# Patient Record
Sex: Female | Born: 1940
Health system: Southern US, Community
[De-identification: ages and names within clinical notes are randomized; demographics above are authoritative.]

## PROBLEM LIST (undated history)

## (undated) DIAGNOSIS — B159 Hepatitis A without hepatic coma: Secondary | ICD-10-CM

## (undated) DIAGNOSIS — H269 Unspecified cataract: Secondary | ICD-10-CM

## (undated) DIAGNOSIS — S0300XA Dislocation of jaw, unspecified side, initial encounter: Secondary | ICD-10-CM

## (undated) DIAGNOSIS — R0600 Dyspnea, unspecified: Secondary | ICD-10-CM

## (undated) DIAGNOSIS — R42 Dizziness and giddiness: Secondary | ICD-10-CM

## (undated) DIAGNOSIS — N6019 Diffuse cystic mastopathy of unspecified breast: Secondary | ICD-10-CM

## (undated) DIAGNOSIS — M1990 Unspecified osteoarthritis, unspecified site: Secondary | ICD-10-CM

## (undated) DIAGNOSIS — I10 Essential (primary) hypertension: Secondary | ICD-10-CM

## (undated) DIAGNOSIS — H33319 Horseshoe tear of retina without detachment, unspecified eye: Secondary | ICD-10-CM

## (undated) DIAGNOSIS — F32A Depression, unspecified: Secondary | ICD-10-CM

## (undated) DIAGNOSIS — M549 Dorsalgia, unspecified: Secondary | ICD-10-CM

## (undated) DIAGNOSIS — D649 Anemia, unspecified: Secondary | ICD-10-CM

## (undated) DIAGNOSIS — F5104 Psychophysiologic insomnia: Secondary | ICD-10-CM

## (undated) HISTORY — DX: Unspecified osteoarthritis, unspecified site: M19.90

## (undated) HISTORY — PX: HAND SURGERY: SHX662

## (undated) HISTORY — PX: PARTIAL HYSTERECTOMY: SHX80

## (undated) HISTORY — PX: PATELLA RECONSTRUCTION: SHX736

## (undated) HISTORY — DX: Psychophysiologic insomnia: F51.04

## (undated) HISTORY — DX: Unspecified cataract: H26.9

## (undated) HISTORY — DX: Horseshoe tear of retina without detachment, unspecified eye: H33.319

## (undated) HISTORY — DX: Dyspnea, unspecified: R06.00

## (undated) HISTORY — DX: Diffuse cystic mastopathy of unspecified breast: N60.19

## (undated) HISTORY — DX: Dorsalgia, unspecified: M54.9

## (undated) HISTORY — DX: Hepatitis a without hepatic coma: B15.9

## (undated) HISTORY — PX: TOTAL KNEE ARTHROPLASTY: SHX125

## (undated) HISTORY — DX: Essential (primary) hypertension: I10

## (undated) HISTORY — PX: TONSILLECTOMY: SUR1361

## (undated) HISTORY — DX: Dizziness and giddiness: R42

## (undated) HISTORY — PX: FOOT SURGERY: SHX648

---

## 1998-10-10 ENCOUNTER — Encounter: Payer: Self-pay | Admitting: Orthopedic Surgery

## 1998-10-17 ENCOUNTER — Inpatient Hospital Stay (HOSPITAL_COMMUNITY): Admission: RE | Admit: 1998-10-17 | Discharge: 1998-10-21 | Payer: Self-pay | Admitting: Orthopedic Surgery

## 1998-12-12 ENCOUNTER — Observation Stay (HOSPITAL_COMMUNITY): Admission: RE | Admit: 1998-12-12 | Discharge: 1998-12-13 | Payer: Self-pay | Admitting: Orthopedic Surgery

## 1999-10-06 HISTORY — PX: TOTAL KNEE ARTHROPLASTY: SHX125

## 2000-09-03 ENCOUNTER — Encounter: Payer: Self-pay | Admitting: Unknown Physician Specialty

## 2000-09-03 ENCOUNTER — Encounter: Admission: RE | Admit: 2000-09-03 | Discharge: 2000-09-03 | Payer: Self-pay | Admitting: Unknown Physician Specialty

## 2001-09-05 ENCOUNTER — Encounter: Payer: Self-pay | Admitting: Unknown Physician Specialty

## 2001-09-05 ENCOUNTER — Encounter: Admission: RE | Admit: 2001-09-05 | Discharge: 2001-09-05 | Payer: Self-pay | Admitting: Unknown Physician Specialty

## 2003-11-02 ENCOUNTER — Ambulatory Visit (HOSPITAL_COMMUNITY): Admission: RE | Admit: 2003-11-02 | Discharge: 2003-11-02 | Payer: Self-pay | Admitting: Unknown Physician Specialty

## 2006-01-19 ENCOUNTER — Encounter: Admission: RE | Admit: 2006-01-19 | Discharge: 2006-01-19 | Payer: Self-pay | Admitting: Unknown Physician Specialty

## 2007-08-10 ENCOUNTER — Encounter: Admission: RE | Admit: 2007-08-10 | Discharge: 2007-08-10 | Payer: Self-pay | Admitting: Unknown Physician Specialty

## 2008-09-05 ENCOUNTER — Encounter: Admission: RE | Admit: 2008-09-05 | Discharge: 2008-09-05 | Payer: Self-pay | Admitting: Unknown Physician Specialty

## 2009-09-23 ENCOUNTER — Encounter: Admission: RE | Admit: 2009-09-23 | Discharge: 2009-09-23 | Payer: Self-pay | Admitting: Unknown Physician Specialty

## 2010-05-14 ENCOUNTER — Encounter: Admission: RE | Admit: 2010-05-14 | Discharge: 2010-05-14 | Payer: Self-pay | Admitting: Family Medicine

## 2010-10-26 ENCOUNTER — Encounter: Payer: Self-pay | Admitting: Unknown Physician Specialty

## 2013-09-07 ENCOUNTER — Ambulatory Visit: Payer: Self-pay | Admitting: Podiatry

## 2013-10-20 ENCOUNTER — Other Ambulatory Visit: Payer: Self-pay | Admitting: Internal Medicine

## 2013-10-20 ENCOUNTER — Ambulatory Visit
Admission: RE | Admit: 2013-10-20 | Discharge: 2013-10-20 | Disposition: A | Discharge status: Home / Self Care | Payer: Medicare Other | Source: Ambulatory Visit | Attending: Internal Medicine | Admitting: Internal Medicine

## 2013-10-20 DIAGNOSIS — M25519 Pain in unspecified shoulder: Secondary | ICD-10-CM

## 2013-10-26 ENCOUNTER — Ambulatory Visit
Admission: RE | Admit: 2013-10-26 | Discharge: 2013-10-26 | Disposition: A | Discharge status: Home / Self Care | Payer: Medicare Other | Source: Ambulatory Visit | Attending: Internal Medicine | Admitting: Internal Medicine

## 2013-10-26 ENCOUNTER — Other Ambulatory Visit: Payer: Self-pay | Admitting: Internal Medicine

## 2013-10-26 DIAGNOSIS — R9389 Abnormal findings on diagnostic imaging of other specified body structures: Secondary | ICD-10-CM

## 2014-10-22 ENCOUNTER — Encounter: Payer: Self-pay | Admitting: *Deleted

## 2017-02-08 ENCOUNTER — Ambulatory Visit
Admission: RE | Admit: 2017-02-08 | Discharge: 2017-02-08 | Disposition: A | Discharge status: Home / Self Care | Payer: Medicare Other | Source: Ambulatory Visit | Attending: Internal Medicine | Admitting: Internal Medicine

## 2017-02-08 ENCOUNTER — Other Ambulatory Visit: Payer: Self-pay | Admitting: Internal Medicine

## 2017-02-08 DIAGNOSIS — R0609 Other forms of dyspnea: Principal | ICD-10-CM

## 2017-02-08 DIAGNOSIS — R0600 Dyspnea, unspecified: Secondary | ICD-10-CM

## 2017-02-11 ENCOUNTER — Other Ambulatory Visit: Payer: Self-pay | Admitting: Internal Medicine

## 2017-02-11 DIAGNOSIS — R0609 Other forms of dyspnea: Principal | ICD-10-CM

## 2017-02-11 DIAGNOSIS — R0600 Dyspnea, unspecified: Secondary | ICD-10-CM

## 2017-02-25 ENCOUNTER — Ambulatory Visit (HOSPITAL_COMMUNITY): Payer: Medicare Other | Attending: Cardiovascular Disease

## 2017-02-25 ENCOUNTER — Other Ambulatory Visit: Payer: Self-pay

## 2017-02-25 VITALS — BP 169/92

## 2017-02-25 DIAGNOSIS — R0600 Dyspnea, unspecified: Secondary | ICD-10-CM

## 2017-02-25 DIAGNOSIS — R0609 Other forms of dyspnea: Secondary | ICD-10-CM

## 2017-02-25 DIAGNOSIS — I5189 Other ill-defined heart diseases: Secondary | ICD-10-CM | POA: Insufficient documentation

## 2017-02-25 MED ORDER — PERFLUTREN LIPID MICROSPHERE
1.0000 mL | INTRAVENOUS | Status: AC | PRN
  Administered 2017-02-25: 2 mL via INTRAVENOUS
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
## 2017-03-29 ENCOUNTER — Encounter: Payer: Self-pay | Admitting: Cardiology

## 2017-03-29 ENCOUNTER — Ambulatory Visit (INDEPENDENT_AMBULATORY_CARE_PROVIDER_SITE_OTHER): Payer: Medicare Other | Admitting: Cardiology

## 2017-03-29 VITALS — BP 154/80 | HR 78 | Ht 64.0 in | Wt 143.0 lb

## 2017-03-29 DIAGNOSIS — I10 Essential (primary) hypertension: Secondary | ICD-10-CM

## 2017-03-29 DIAGNOSIS — R072 Precordial pain: Secondary | ICD-10-CM

## 2017-03-29 DIAGNOSIS — R0609 Other forms of dyspnea: Secondary | ICD-10-CM

## 2017-03-29 DIAGNOSIS — R0602 Shortness of breath: Secondary | ICD-10-CM

## 2017-03-29 DIAGNOSIS — R0600 Dyspnea, unspecified: Secondary | ICD-10-CM

## 2017-03-29 MED ORDER — METOPROLOL SUCCINATE ER 25 MG PO TB24: 25.0000 mg | ORAL_TABLET | Freq: Every day | ORAL | 3 refills | Status: DC

## 2017-03-29 MED ORDER — ASPIRIN EC 81 MG PO TBEC: 81.0000 mg | DELAYED_RELEASE_TABLET | Freq: Every day | ORAL | 3 refills | Status: DC

## 2017-03-29 NOTE — Patient Instructions (Signed)
Medication Instructions:   STOP AMLODIPINE  START METOPROLOL SUCC ER 25 MG ONCE DAILY AT BEDTIME  START ASPIRIN 81 MG ONCE DAILY  Follow-Up:  Your physician recommends that you schedule a follow-up appointment in: [place]

## 2017-03-30 ENCOUNTER — Other Ambulatory Visit: Payer: Self-pay

## 2017-03-30 ENCOUNTER — Telehealth: Payer: Self-pay | Admitting: Cardiology

## 2017-03-30 MED ORDER — METOPROLOL SUCCINATE ER 25 MG PO TB24: 25.0000 mg | ORAL_TABLET | Freq: Every day | ORAL | 6 refills | Status: DC

## 2017-03-30 NOTE — Telephone Encounter (Signed)
New message     Pt wants paper copy of the office notes from yesterdays visit and she wants a paper copy of the medication he gave her also.

## 2017-03-30 NOTE — Telephone Encounter (Signed)
Returned call to patient.She stated she wanted a copy of yesterdays office visit with Dr.Crenshaw and 30 day written prescription for metoprolol mailed to her.Metoprolol prescription and office visit mailed to her.

## 2017-04-13 ENCOUNTER — Telehealth: Payer: Self-pay | Admitting: Cardiology

## 2017-04-13 NOTE — Telephone Encounter (Signed)
Received records from [facility]. Records given to Dr Stanford Breed for review.

## 2017-05-26 ENCOUNTER — Telehealth: Payer: Self-pay | Admitting: Cardiology

## 2017-05-26 NOTE — Telephone Encounter (Signed)
Spoke with pt she states that her blood pressure,BP came down to 133/73 HR 77 @335pm . She states that this is a new BP machine, she will take BP again tonight at normal time that she usually takes her metoprolol and asa(she is afraid that her BP will be too low while she is sleeping), if normal she will resume medication in the am and take BP again in the am before and after medication. She will call back if further direction is needed.

## 2017-05-26 NOTE — Telephone Encounter (Signed)
New message    Pt is calling about her blood pressure.  Pt c/o BP issue: STAT if pt c/o blurred vision, one-sided weakness or slurred speech  1. What are your last 5 BP readings? 190/90  2. Are you having any other symptoms (ex. Dizziness, headache, blurred vision, passed out)? No symptoms.  3. What is your BP issue? Pt states her bp is running high. She said she is on lisinopril and metoprolol. She said when her bp was high, she took a baby aspirin and it did not come down. She had some amlodipine from before so she took that and it came down. She said tonight she isn't going to take what was given to her tonight because that would be too many different medicines.

## 2017-05-27 ENCOUNTER — Telehealth: Payer: Self-pay | Admitting: Cardiology

## 2017-05-27 NOTE — Telephone Encounter (Signed)
New message    Pt c/o BP issue: STAT if pt c/o blurred vision, one-sided weakness or slurred speech  1. What are your last 5 BP readings?  190/90 the last few days, it is not coming down with the baby aspirin   Pt c/o medication issue:  1. Name of Medication: metoprolol succinate (TOPROL XL) 25 MG 24 hr tablet  2. How are you currently taking this medication (dosage and times per day)?  Take 1 tablet (25 mg total) by mouth daily.  3. Are you having a reaction (difficulty breathing--STAT)? Yes   4. What is your medication issue?   She took the metoprolol last night and her bp skyrocked again, she does not want to continue taking this , she took the amlodipine she has left and it came back down

## 2017-05-27 NOTE — Telephone Encounter (Signed)
Spoke with pt, this morning after taking her lisinopril her bp was 179/?Marland Kitchen She took her metoprolol last night. She feels the metoprolol is causing her bp to spike. This has been going on for the last week. She has taken the amlodipine and her bp will go down. The amlodipine was stopped due to swelling and the swelling is gone. She does not want to continue to take the metoprolol or increase it. She also does not really want to increase the lisinopril. She wants to go back on the amlodipine if okay with dr Stanford Breed. She is aware it may cause swelling again. Will forward for dr Stanford Breed review

## 2017-05-27 NOTE — Telephone Encounter (Signed)
Ok to stop metoprolol and resume amlodipine at previous dose Kirk Ruths

## 2017-05-28 MED ORDER — AMLODIPINE BESYLATE 10 MG PO TABS: 10.0000 mg | ORAL_TABLET | Freq: Every day | ORAL | 3 refills | Status: DC

## 2017-05-28 NOTE — Telephone Encounter (Signed)
Spoke with pt, Aware of dr crenshaw's recommendations.  °

## 2017-06-18 ENCOUNTER — Encounter: Payer: Self-pay | Admitting: Cardiology
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
## 2017-07-01 ENCOUNTER — Ambulatory Visit (INDEPENDENT_AMBULATORY_CARE_PROVIDER_SITE_OTHER): Payer: Medicare Other | Admitting: Cardiology

## 2017-07-01 ENCOUNTER — Encounter: Payer: Self-pay | Admitting: Cardiology

## 2017-07-01 VITALS — BP 148/72 | HR 80 | Ht 64.0 in | Wt 147.0 lb

## 2017-07-01 DIAGNOSIS — I428 Other cardiomyopathies: Secondary | ICD-10-CM | POA: Diagnosis not present

## 2017-07-01 DIAGNOSIS — I10 Essential (primary) hypertension: Secondary | ICD-10-CM | POA: Diagnosis not present

## 2017-07-01 MED ORDER — LOSARTAN POTASSIUM 50 MG PO TABS: 50.0000 mg | ORAL_TABLET | Freq: Every day | ORAL | 3 refills | Status: DC

## 2017-07-01 MED ORDER — AMLODIPINE BESYLATE 5 MG PO TABS: 5.0000 mg | ORAL_TABLET | Freq: Every day | ORAL | 3 refills | Status: DC

## 2017-07-01 NOTE — Patient Instructions (Signed)
Medication Instructions:   DECREASE AMLODIPINE TO 5 MG ONCE DAILY= 1/2 OF THE 10 MG TABLET ONCE DAILY  START LOSARTAN 50 MG ONCE DAILY  Labwork:  Your physician recommends that you return for lab work in: [place]:  Your physician recommends that you schedule a follow-up appointment in: 3-4 [place]   If you need a refill on your cardiac medications before your next appointment, please call your pharmacy.

## 2017-07-07 ENCOUNTER — Telehealth: Payer: Self-pay | Admitting: Cardiology

## 2017-07-07 NOTE — Telephone Encounter (Signed)
Pt verbalized understanding, will follow BPs twice daily and call back in ~ 1 week w readings.

## 2017-07-07 NOTE — Telephone Encounter (Signed)
Pt c/o medication issue:  1. Name of Medication: losartan potassium   2. How are you currently taking this medication (dosage and times per day)? 50mg  1x day  3. Are you having a reaction (difficulty breathing--STAT)? no 4. What is your medication issue? Dizzy husband had to help her to the br

## 2017-07-07 NOTE — Telephone Encounter (Signed)
Agree with plan Katie Vasquez  

## 2017-07-07 NOTE — Telephone Encounter (Signed)
Pt of Dr. Stanford Breed  Recent med change- amlodipine reduced from 10mg  to 5mg  daily Losartan started at 50mg  daily  I spoke to patient. She reports dizziness last night around 3am - states she noticed when she woke up to go to the bathroom. She required assistance from her husband to get to and from bathroom.  States dizziness resolved approximately 7am. She didn't check her BP until about 1:30pm today, at which time it was 130/68.  She wanted to know if anything advised.  I confirmed she was taking medications as prescribed.  Recommended she cut the losartan to 25mg  daily and follow her BPs and symptoms for a few days, record BPs, and come in as scheduled this week for her recommended BMET.  Informed pt I'd seek further rec's from Dr. Stanford Breed and f/u w her. Pt verbalized understanding and thanks.

## 2017-07-09 ENCOUNTER — Telehealth: Payer: Self-pay | Admitting: Cardiology

## 2017-07-09 DIAGNOSIS — I428 Other cardiomyopathies: Secondary | ICD-10-CM

## 2017-07-09 MED ORDER — AMLODIPINE BESYLATE 5 MG PO TABS: 5.0000 mg | ORAL_TABLET | Freq: Two times a day (BID) | ORAL | 3 refills | Status: DC

## 2017-07-09 NOTE — Telephone Encounter (Signed)
Returned call to patient of Dr. Stanford Breed who was last seen 9/27 who is calling about high BP but is asymptomatic. She is trying to increasing her activity but walking in her driveway.   10/5 - 175/84 - before AM meds            122/62 after meds           122/62 (9pm) 10/4 - 173/91 - before AM meds  ** takes losartan 25mg  & amlodipine 5mg  in the AM ** takes metoprolol 25mg  in evening with ASA ** she states amlodipine keeps her BP down but this was reduced from 10 to 5mg  on 9/27 so that an ARB (losartan 50mg  could be added, and since this med has been decreased to 25mg  d/t dizziness per phone call 10/3)  Advised would route to MD to review and would follow up w/her

## 2017-07-09 NOTE — Telephone Encounter (Signed)
Spoke with pt, she reports she is not going to increase the losartan to 50 because that is what has caused her dizziness. She wants to know if she can take amlodipine 5 mg twice daily. Will forward for dr Stanford Breed review

## 2017-07-09 NOTE — Telephone Encounter (Signed)
Would dc amlodipine and increase cozaar to 50 mg daily; folllow BP and will adjust as needed Kirk Ruths

## 2017-07-09 NOTE — Telephone Encounter (Signed)
Left message for pt to call.

## 2017-07-09 NOTE — Telephone Encounter (Signed)
Ok to take amlodipine BID Kirk Ruths

## 2017-07-09 NOTE — Telephone Encounter (Signed)
Pt c/o BP issue: STAT if pt c/o blurred vision, one-sided weakness or slurred speech  1. What are your last 5 BP readings? 175/84 2. Are you having any other symptoms (ex. Dizziness, headache, blurred vision, passed out)? no 3. What is your BP issue? High  She also want to know where do Dr.Crenshaw want her to go to have her labs done

## 2017-07-09 NOTE — Telephone Encounter (Signed)
Spoke with pt, Katie Vasquez of dr Jacalyn Lefevre recommendations. She will call when script is needed.

## 2017-07-10 LAB — BASIC METABOLIC PANEL
BUN/Creatinine Ratio: 22 (ref 12–28)
BUN: 20 mg/dL (ref 8–27)
CALCIUM: 9.3 mg/dL (ref 8.7–10.3)
CHLORIDE: 102 mmol/L (ref 96–106)
CO2: 27 mmol/L (ref 20–29)
Creatinine, Ser: 0.92 mg/dL (ref 0.57–1.00)
GFR, EST AFRICAN AMERICAN: 70 mL/min/{1.73_m2} (ref >59)
GFR, EST NON AFRICAN AMERICAN: 61 mL/min/{1.73_m2} (ref >59)
Glucose: 130 mg/dL — ABNORMAL HIGH (ref 65–99)
POTASSIUM: 4.4 mmol/L (ref 3.5–5.2)
SODIUM: 140 mmol/L (ref 134–144)

## 2017-07-19 ENCOUNTER — Telehealth: Payer: Self-pay | Admitting: Cardiology

## 2017-07-19 ENCOUNTER — Other Ambulatory Visit: Payer: Self-pay | Admitting: Cardiology

## 2017-07-19 DIAGNOSIS — I428 Other cardiomyopathies: Secondary | ICD-10-CM

## 2017-07-19 MED ORDER — AMLODIPINE BESYLATE 5 MG PO TABS: 5.0000 mg | ORAL_TABLET | Freq: Two times a day (BID) | ORAL | 10 refills | Status: DC

## 2017-07-19 NOTE — Telephone Encounter (Signed)
New Message  Pt call requesting to speak with RN. Pt states she was please with her bp. Pt states she takes   AM.... amlodipine 5mg  metoprolol 25mg .  PM... losartan 1/2 25mg   5mg  amlodipine.

## 2017-07-19 NOTE — Telephone Encounter (Signed)
Pt's medication was resent to pt's pharmacy, because medication was on print and enough medication was not sent in right. I resent pt's medication to the requested pharmacy. Confirmation received.

## 2017-07-19 NOTE — Telephone Encounter (Signed)
S/w pt states that she increased her medications ~ a week ago she states that her BP was running before the change 170-179/80-90 and her BP is now 137/68 at night, that was when she was running high. She will continue to take BP and call back with readings Friday/ in one week. Will forward to Dr Stanford Breed

## 2017-09-30 ENCOUNTER — Ambulatory Visit: Payer: Medicare Other | Admitting: Cardiology

## 2017-10-29 ENCOUNTER — Telehealth: Payer: Self-pay | Admitting: Cardiology

## 2017-10-29 MED ORDER — LISINOPRIL 10 MG PO TABS: 10.0000 mg | ORAL_TABLET | Freq: Every day | ORAL | 3 refills | Status: DC

## 2017-10-29 NOTE — Telephone Encounter (Signed)
Spoke with pt, okay per dr Stanford Breed for patient to stop losartan. She will restart lisinopril at 10 mg once daily. She will continue the metoprolol but will taper and stop amlodipine. She is aware her bp will elevated but the lisinopril can be adjusted for that. She will call with problems prior to appt.

## 2017-10-29 NOTE — Telephone Encounter (Signed)
Pt is having severe diarrhea,she think it might be coming from some of her medicine.Her legs are also swollen,they are so swollen they are hard.SHe says they swell every day.

## 2017-10-29 NOTE — Telephone Encounter (Signed)
Spoke with pt, she reports having severe diarrhea since September after starting losartan. She also reports swelling in her legs from the knee down by 7 pm. They are down in the mornings. She has SOB when walking up stairs but this has been her normal for couple years now. She does not weigh. She would like to go back to lisinopril and stop the losartan and metoprolol. She does not feel her swelling is related to amlodipine because she has taken it for years without problems. Will discuss with dr Stanford Breed.

## 2017-11-12 DIAGNOSIS — R197 Diarrhea, unspecified: Secondary | ICD-10-CM | POA: Diagnosis not present

## 2017-11-16 DIAGNOSIS — R197 Diarrhea, unspecified: Secondary | ICD-10-CM | POA: Diagnosis not present
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
## 2017-12-08 ENCOUNTER — Encounter: Payer: Self-pay | Admitting: Cardiology

## 2017-12-08 ENCOUNTER — Ambulatory Visit: Payer: Medicare HMO | Admitting: Cardiology

## 2017-12-08 VITALS — BP 148/76 | HR 72 | Ht 64.0 in | Wt 147.0 lb

## 2017-12-08 DIAGNOSIS — I10 Essential (primary) hypertension: Secondary | ICD-10-CM

## 2017-12-08 DIAGNOSIS — I428 Other cardiomyopathies: Secondary | ICD-10-CM | POA: Diagnosis not present

## 2017-12-08 DIAGNOSIS — R072 Precordial pain: Secondary | ICD-10-CM | POA: Diagnosis not present

## 2017-12-08 MED ORDER — LISINOPRIL 40 MG PO TABS: 40.0000 mg | ORAL_TABLET | Freq: Every day | ORAL | 3 refills | Status: DC

## 2017-12-08 MED ORDER — AMLODIPINE BESYLATE 5 MG PO TABS: 5.0000 mg | ORAL_TABLET | Freq: Every day | ORAL | 3 refills | Status: DC

## 2017-12-08 NOTE — Patient Instructions (Signed)
Medication Instructions:   START AMLODIPINE 5 MG ONCE DAILY  Labwork:  Your physician recommends that you HAVE LAB WORK TODAY  Follow-Up:  Your physician wants you to follow-up in: [place] will receive a reminder letter in the mail two months in advance. If you don't receive a letter, please call our office to schedule the follow-up appointment.   If you need a refill on your cardiac medications before your next appointment, please call your pharmacy.

## 2017-12-09 ENCOUNTER — Encounter: Payer: Self-pay | Admitting: *Deleted

## 2017-12-09 DIAGNOSIS — I10 Essential (primary) hypertension: Secondary | ICD-10-CM | POA: Diagnosis not present

## 2017-12-09 DIAGNOSIS — R079 Chest pain, unspecified: Secondary | ICD-10-CM | POA: Diagnosis not present

## 2017-12-09 LAB — BASIC METABOLIC PANEL
BUN/Creatinine Ratio: 22 (ref 12–28)
BUN: 17 mg/dL (ref 8–27)
CO2: 25 mmol/L (ref 20–29)
Calcium: 9.4 mg/dL (ref 8.7–10.3)
Chloride: 104 mmol/L (ref 96–106)
Creatinine, Ser: 0.76 mg/dL (ref 0.57–1.00)
GFR calc Af Amer: 88 mL/min/{1.73_m2} (ref >59)
GFR, EST NON AFRICAN AMERICAN: 76 mL/min/{1.73_m2} (ref >59)
Glucose: 80 mg/dL (ref 65–99)
POTASSIUM: 4.6 mmol/L (ref 3.5–5.2)
SODIUM: 142 mmol/L (ref 134–144)

## 2017-12-10 ENCOUNTER — Emergency Department (HOSPITAL_COMMUNITY)
Admission: EM | Admit: 2017-12-10 | Discharge: 2017-12-10 | Disposition: A | Discharge status: Home / Self Care | Payer: Medicare HMO | Attending: Emergency Medicine | Admitting: Emergency Medicine

## 2017-12-10 ENCOUNTER — Encounter (HOSPITAL_COMMUNITY): Payer: Self-pay

## 2017-12-10 ENCOUNTER — Emergency Department (HOSPITAL_COMMUNITY): Payer: Medicare HMO

## 2017-12-10 ENCOUNTER — Other Ambulatory Visit: Payer: Self-pay

## 2017-12-10 ENCOUNTER — Telehealth: Payer: Self-pay | Admitting: Cardiology

## 2017-12-10 DIAGNOSIS — Z7982 Long term (current) use of aspirin: Secondary | ICD-10-CM | POA: Diagnosis not present

## 2017-12-10 DIAGNOSIS — R0789 Other chest pain: Secondary | ICD-10-CM

## 2017-12-10 DIAGNOSIS — R079 Chest pain, unspecified: Secondary | ICD-10-CM | POA: Diagnosis not present

## 2017-12-10 DIAGNOSIS — I10 Essential (primary) hypertension: Secondary | ICD-10-CM

## 2017-12-10 DIAGNOSIS — R1013 Epigastric pain: Secondary | ICD-10-CM | POA: Diagnosis not present

## 2017-12-10 DIAGNOSIS — Z96652 Presence of left artificial knee joint: Secondary | ICD-10-CM | POA: Insufficient documentation

## 2017-12-10 DIAGNOSIS — Z79899 Other long term (current) drug therapy: Secondary | ICD-10-CM | POA: Insufficient documentation

## 2017-12-10 LAB — URINALYSIS, ROUTINE W REFLEX MICROSCOPIC
BILIRUBIN URINE: NEGATIVE
Glucose, UA: NEGATIVE mg/dL
Hgb urine dipstick: NEGATIVE
Ketones, ur: NEGATIVE mg/dL
Leukocytes, UA: NEGATIVE
NITRITE: NEGATIVE
PH: 7 (ref 5.0–8.0)
Protein, ur: NEGATIVE mg/dL
SPECIFIC GRAVITY, URINE: 1.01 (ref 1.005–1.030)

## 2017-12-10 LAB — CBC WITH DIFFERENTIAL/PLATELET
BASOS PCT: 0 %
Basophils Absolute: 0 10*3/uL (ref 0.0–0.1)
EOS ABS: 0.6 10*3/uL (ref 0.0–0.7)
Eosinophils Relative: 8 %
HEMATOCRIT: 43.1 % (ref 36.0–46.0)
HEMOGLOBIN: 14.2 g/dL (ref 12.0–15.0)
LYMPHS ABS: 2.8 10*3/uL (ref 0.7–4.0)
Lymphocytes Relative: 34 %
MCH: 28 pg (ref 26.0–34.0)
MCHC: 32.9 g/dL (ref 30.0–36.0)
MCV: 85 fL (ref 78.0–100.0)
MONO ABS: 0.5 10*3/uL (ref 0.1–1.0)
MONOS PCT: 7 %
NEUTROS PCT: 51 %
Neutro Abs: 4.1 10*3/uL (ref 1.7–7.7)
Platelets: 374 10*3/uL (ref 150–400)
RBC: 5.07 MIL/uL (ref 3.87–5.11)
RDW: 13.5 % (ref 11.5–15.5)
WBC: 8 10*3/uL (ref 4.0–10.5)

## 2017-12-10 LAB — COMPREHENSIVE METABOLIC PANEL
ALBUMIN: 4.1 g/dL (ref 3.5–5.0)
ALK PHOS: 62 U/L (ref 38–126)
ALT: 19 U/L (ref 14–54)
ANION GAP: 11 (ref 5–15)
AST: 20 U/L (ref 15–41)
BILIRUBIN TOTAL: 0.5 mg/dL (ref 0.3–1.2)
BUN: 19 mg/dL (ref 6–20)
CALCIUM: 9.5 mg/dL (ref 8.9–10.3)
CO2: 26 mmol/L (ref 22–32)
Chloride: 105 mmol/L (ref 101–111)
Creatinine, Ser: 0.86 mg/dL (ref 0.44–1.00)
GFR calc Af Amer: >60 mL/min (ref >60)
GFR calc non Af Amer: >60 mL/min (ref >60)
GLUCOSE: 91 mg/dL (ref 65–99)
POTASSIUM: 3.9 mmol/L (ref 3.5–5.1)
SODIUM: 142 mmol/L (ref 135–145)
TOTAL PROTEIN: 6.8 g/dL (ref 6.5–8.1)

## 2017-12-10 LAB — I-STAT TROPONIN, ED
TROPONIN I, POC: 0 ng/mL (ref 0.00–0.08)
TROPONIN I, POC: 0 ng/mL (ref 0.00–0.08)

## 2017-12-10 LAB — LIPASE, BLOOD: Lipase: 52 U/L — ABNORMAL HIGH (ref 11–51)

## 2017-12-10 MED ORDER — AMLODIPINE BESYLATE 5 MG PO TABS
5.0000 mg | ORAL_TABLET | Freq: Once | ORAL | Status: AC
Start: 2017-12-10 — End: 2017-12-10
  Administered 2017-12-10: 5 mg via ORAL
  Filled 2017-12-10: qty 1

## 2017-12-10 MED ORDER — AMLODIPINE BESYLATE 10 MG PO TABS: 10.0000 mg | ORAL_TABLET | Freq: Every day | ORAL | 0 refills | Status: DC

## 2017-12-10 MED ORDER — IOPAMIDOL (ISOVUE-370) INJECTION 76%
INTRAVENOUS | Status: AC
  Administered 2017-12-10: 100 mL
  Filled 2017-12-10: qty 100

## 2017-12-10 MED ORDER — HYDRALAZINE HCL 20 MG/ML IJ SOLN
5.0000 mg | Freq: Once | INTRAMUSCULAR | Status: AC
  Administered 2017-12-10: 5 mg via INTRAVENOUS
  Filled 2017-12-10: qty 1

## 2017-12-10 NOTE — ED Notes (Signed)
Urine specimen sent to lab

## 2017-12-10 NOTE — Discharge Instructions (Signed)
Continue your metoprolol and lisinopril as prescribed.   Increase norvasc to 10 mg daily.   Please see your primary care doctor or cardiologist in a week to recheck your blood pressure. Continue keeping a log of your blood pressures   Return to ER if you have worse chest pain, trouble breathing, abdominal pain, flank pain, vomiting.

## 2017-12-10 NOTE — ED Notes (Signed)
Pt missed urine cup when attempting to provide sample, will reattempt collection

## 2017-12-10 NOTE — ED Notes (Signed)
Patient ambulated to bathroom unassisted.

## 2017-12-10 NOTE — ED Notes (Signed)
Pt ambulated to the restroom without difficulty. Gait steady and even.  

## 2017-12-10 NOTE — Telephone Encounter (Signed)
Returned call to patient of Dr. Stanford Breed who was seen on 12/08/17. She reports she is on the way to hospital now, at the direction of her PCP.   Routed to MD as Juluis Rainier

## 2017-12-10 NOTE — ED Notes (Signed)
Patient ambulatory to bathroom with steady gait at this time 

## 2017-12-10 NOTE — ED Triage Notes (Signed)
Pt arrives to ED from home with complaints of chest pressure since 0930 this morning. EMS reports pt took 324 asa at home and was given an additional 81 mg by EMS. Pt denies pain upon arrival, a/ox4; ambulatory to stretcher. Pt has left valve regurgitation, otherwise no cardiac hx. Pt takes meds for htn. Pt placed in position of comfort with bed locked and lowered, call bell in reach.
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
## 2017-12-10 NOTE — ED Notes (Signed)
Patient transported to x-ray. ?

## 2017-12-10 NOTE — ED Notes (Signed)
ED Provider at bedside. 

## 2017-12-10 NOTE — Telephone Encounter (Signed)
Mrs. Gatliff is calling because she is having pain between her breats pretty much all day ( does not want to go to the hospital) . Want to know what can be prescribe for her . Please call   Thanks

## 2017-12-13 DIAGNOSIS — K580 Irritable bowel syndrome with diarrhea: Secondary | ICD-10-CM | POA: Diagnosis not present

## 2017-12-27 DIAGNOSIS — R0789 Other chest pain: Secondary | ICD-10-CM | POA: Diagnosis not present

## 2017-12-27 DIAGNOSIS — I10 Essential (primary) hypertension: Secondary | ICD-10-CM | POA: Diagnosis not present

## 2017-12-28 DIAGNOSIS — R69 Illness, unspecified: Secondary | ICD-10-CM | POA: Diagnosis not present

## 2018-01-07 ENCOUNTER — Other Ambulatory Visit: Payer: Self-pay | Admitting: Internal Medicine

## 2018-01-07 DIAGNOSIS — M7542 Impingement syndrome of left shoulder: Secondary | ICD-10-CM | POA: Diagnosis not present

## 2018-01-07 DIAGNOSIS — M542 Cervicalgia: Secondary | ICD-10-CM | POA: Insufficient documentation

## 2018-01-07 DIAGNOSIS — M519 Unspecified thoracic, thoracolumbar and lumbosacral intervertebral disc disorder: Secondary | ICD-10-CM | POA: Diagnosis not present

## 2018-01-10 ENCOUNTER — Other Ambulatory Visit: Payer: Self-pay | Admitting: Internal Medicine

## 2018-01-10 ENCOUNTER — Telehealth: Payer: Self-pay | Admitting: Cardiology

## 2018-01-10 DIAGNOSIS — Z1231 Encounter for screening mammogram for malignant neoplasm of breast: Secondary | ICD-10-CM

## 2018-01-10 NOTE — Telephone Encounter (Signed)
Pt c/o medication issue:  1. Name of Medication: Prednisone   2. How are you currently taking this medication (dosage and times per day)? 5 mg  3. Are you having a reaction (difficulty breathing--STAT)? no  4. What is your medication issue? Patient calling, States that she was given a prescription for Prednisone 5 mg by Dr.Bean  and was instructed to call cardiologist  and verify that it would be okay to start medication.

## 2018-01-10 NOTE — Telephone Encounter (Signed)
Ok for prednisone; call if increasing SOB Kirk Ruths

## 2018-01-10 NOTE — Telephone Encounter (Signed)
Spoke with pt, Aware of dr crenshaw's recommendations.  °

## 2018-01-10 NOTE — Telephone Encounter (Signed)
Routed to MD/RN 

## 2018-01-13 DIAGNOSIS — N644 Mastodynia: Secondary | ICD-10-CM | POA: Diagnosis not present

## 2018-02-15 DIAGNOSIS — M519 Unspecified thoracic, thoracolumbar and lumbosacral intervertebral disc disorder: Secondary | ICD-10-CM | POA: Diagnosis not present

## 2018-02-15 DIAGNOSIS — M542 Cervicalgia: Secondary | ICD-10-CM | POA: Diagnosis not present

## 2018-03-08 DIAGNOSIS — R69 Illness, unspecified: Secondary | ICD-10-CM | POA: Diagnosis not present

## 2018-04-02 ENCOUNTER — Other Ambulatory Visit: Payer: Self-pay | Admitting: Cardiology

## 2018-06-03 DIAGNOSIS — I5042 Chronic combined systolic (congestive) and diastolic (congestive) heart failure: Secondary | ICD-10-CM | POA: Diagnosis not present

## 2018-06-03 DIAGNOSIS — Z1389 Encounter for screening for other disorder: Secondary | ICD-10-CM | POA: Diagnosis not present

## 2018-06-03 DIAGNOSIS — Z0000 Encounter for general adult medical examination without abnormal findings: Secondary | ICD-10-CM | POA: Diagnosis not present

## 2018-06-03 DIAGNOSIS — I10 Essential (primary) hypertension: Secondary | ICD-10-CM | POA: Diagnosis not present

## 2018-06-07 DIAGNOSIS — M25551 Pain in right hip: Secondary | ICD-10-CM | POA: Diagnosis not present

## 2018-06-07 DIAGNOSIS — M1611 Unilateral primary osteoarthritis, right hip: Secondary | ICD-10-CM | POA: Diagnosis not present

## 2018-06-14 DIAGNOSIS — D473 Essential (hemorrhagic) thrombocythemia: Secondary | ICD-10-CM | POA: Diagnosis not present

## 2018-06-14 DIAGNOSIS — M791 Myalgia, unspecified site: Secondary | ICD-10-CM | POA: Diagnosis not present

## 2018-06-14 DIAGNOSIS — M79651 Pain in right thigh: Secondary | ICD-10-CM | POA: Diagnosis not present

## 2018-06-14 DIAGNOSIS — M2550 Pain in unspecified joint: Secondary | ICD-10-CM | POA: Diagnosis not present

## 2018-06-14 DIAGNOSIS — M1611 Unilateral primary osteoarthritis, right hip: Secondary | ICD-10-CM | POA: Diagnosis not present

## 2018-06-15 DIAGNOSIS — M25551 Pain in right hip: Secondary | ICD-10-CM | POA: Insufficient documentation

## 2018-06-17 ENCOUNTER — Other Ambulatory Visit: Payer: Self-pay | Admitting: Internal Medicine

## 2018-06-17 ENCOUNTER — Ambulatory Visit
Admission: RE | Admit: 2018-06-17 | Discharge: 2018-06-17 | Disposition: A | Discharge status: Home / Self Care | Payer: Medicare HMO | Source: Ambulatory Visit | Attending: Internal Medicine | Admitting: Internal Medicine

## 2018-06-17 DIAGNOSIS — M542 Cervicalgia: Secondary | ICD-10-CM | POA: Diagnosis not present

## 2018-06-17 DIAGNOSIS — M546 Pain in thoracic spine: Secondary | ICD-10-CM | POA: Diagnosis not present

## 2018-06-17 DIAGNOSIS — R5383 Other fatigue: Secondary | ICD-10-CM | POA: Diagnosis not present

## 2018-06-17 DIAGNOSIS — M2550 Pain in unspecified joint: Secondary | ICD-10-CM | POA: Diagnosis not present

## 2018-06-17 DIAGNOSIS — R509 Fever, unspecified: Secondary | ICD-10-CM | POA: Diagnosis not present

## 2018-06-20 DIAGNOSIS — M542 Cervicalgia: Secondary | ICD-10-CM | POA: Diagnosis not present

## 2018-06-20 DIAGNOSIS — M25551 Pain in right hip: Secondary | ICD-10-CM | POA: Diagnosis not present

## 2018-06-22 DIAGNOSIS — M519 Unspecified thoracic, thoracolumbar and lumbosacral intervertebral disc disorder: Secondary | ICD-10-CM | POA: Diagnosis not present

## 2018-06-22 DIAGNOSIS — M25551 Pain in right hip: Secondary | ICD-10-CM | POA: Diagnosis not present

## 2018-06-22 DIAGNOSIS — M1611 Unilateral primary osteoarthritis, right hip: Secondary | ICD-10-CM | POA: Diagnosis not present

## 2018-06-22 DIAGNOSIS — M542 Cervicalgia: Secondary | ICD-10-CM | POA: Diagnosis not present

## 2018-06-23 DIAGNOSIS — M1611 Unilateral primary osteoarthritis, right hip: Secondary | ICD-10-CM | POA: Insufficient documentation

## 2018-07-04 DIAGNOSIS — R5383 Other fatigue: Secondary | ICD-10-CM | POA: Diagnosis not present

## 2018-07-04 DIAGNOSIS — D72829 Elevated white blood cell count, unspecified: Secondary | ICD-10-CM | POA: Diagnosis not present

## 2018-07-04 DIAGNOSIS — M2550 Pain in unspecified joint: Secondary | ICD-10-CM | POA: Diagnosis not present

## 2018-07-04 DIAGNOSIS — R197 Diarrhea, unspecified: Secondary | ICD-10-CM | POA: Diagnosis not present
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
## 2018-07-08 ENCOUNTER — Encounter: Payer: Self-pay | Admitting: Cardiology

## 2018-07-08 ENCOUNTER — Ambulatory Visit: Payer: Medicare HMO | Admitting: Cardiology

## 2018-07-08 VITALS — BP 170/84 | HR 78 | Ht 64.0 in | Wt 142.4 lb

## 2018-07-08 DIAGNOSIS — R918 Other nonspecific abnormal finding of lung field: Secondary | ICD-10-CM | POA: Diagnosis not present

## 2018-07-08 DIAGNOSIS — I10 Essential (primary) hypertension: Secondary | ICD-10-CM | POA: Diagnosis not present

## 2018-07-08 DIAGNOSIS — I428 Other cardiomyopathies: Secondary | ICD-10-CM | POA: Diagnosis not present

## 2018-07-08 MED ORDER — HYDROCHLOROTHIAZIDE 12.5 MG PO CAPS: 12.5000 mg | ORAL_CAPSULE | Freq: Every day | ORAL | 3 refills | Status: DC

## 2018-07-08 NOTE — Patient Instructions (Signed)
Medication Instructions:  START HCTZ 12.5 MG ONCE DAILY If you need a refill on your cardiac medications before your next appointment, please call your pharmacy.   Lab work: Your physician recommends that you return for lab work in: [place] If you have labs (blood work) drawn today and your tests are completely normal, you will receive your results only by: Marland Kitchen MyChart Message (if you have MyChart) OR . A paper copy in the mail If you have any lab test that is abnormal or we need to change your treatment, we will call you to review the results.  Testing/Procedures: CT SCAN OF THE CHEST WO CONTRAST TO FOLLOW UP LUNG NODULE-SCHEDULE IN January 2020  Follow-Up: At [facility], you and your health needs are our priority.  As part of our continuing mission to provide you with exceptional heart care, we have created designated Provider Care Teams.  These Care Teams include your primary Cardiologist (physician) and Advanced Practice Providers (APPs -  Physician Assistants and Nurse Practitioners) who all work together to provide you with the care you need, when you need it. You will need a follow up appointment in 6 months.  Please call our office 2 months in advance to schedule this appointment.  You may see DR Stanford Breed or one of the following Advanced Practice Providers on your designated Care Team:   Kerin Ransom, PA-C Roby Lofts, [place] . Sande Rives, PA-C

## 2018-07-12 DIAGNOSIS — R700 Elevated erythrocyte sedimentation rate: Secondary | ICD-10-CM | POA: Diagnosis not present

## 2018-07-12 DIAGNOSIS — M5030 Other cervical disc degeneration, unspecified cervical region: Secondary | ICD-10-CM | POA: Diagnosis not present

## 2018-07-12 DIAGNOSIS — M1990 Unspecified osteoarthritis, unspecified site: Secondary | ICD-10-CM | POA: Diagnosis not present

## 2018-07-12 DIAGNOSIS — R7982 Elevated C-reactive protein (CRP): Secondary | ICD-10-CM | POA: Diagnosis not present

## 2018-07-12 DIAGNOSIS — M353 Polymyalgia rheumatica: Secondary | ICD-10-CM | POA: Diagnosis not present

## 2018-07-15 DIAGNOSIS — R918 Other nonspecific abnormal finding of lung field: Secondary | ICD-10-CM | POA: Diagnosis not present

## 2018-07-15 DIAGNOSIS — I10 Essential (primary) hypertension: Secondary | ICD-10-CM | POA: Diagnosis not present

## 2018-07-16 LAB — BASIC METABOLIC PANEL
BUN/Creatinine Ratio: 22 (ref 12–28)
BUN: 20 mg/dL (ref 8–27)
CHLORIDE: 96 mmol/L (ref 96–106)
CO2: 23 mmol/L (ref 20–29)
Calcium: 10 mg/dL (ref 8.7–10.3)
Creatinine, Ser: 0.89 mg/dL (ref 0.57–1.00)
GFR calc Af Amer: 72 mL/min/{1.73_m2} (ref >59)
GFR calc non Af Amer: 63 mL/min/{1.73_m2} (ref >59)
GLUCOSE: 90 mg/dL (ref 65–99)
POTASSIUM: 5 mmol/L (ref 3.5–5.2)
Sodium: 136 mmol/L (ref 134–144)

## 2018-07-18 ENCOUNTER — Encounter: Payer: Self-pay | Admitting: *Deleted

## 2018-07-21 DIAGNOSIS — M542 Cervicalgia: Secondary | ICD-10-CM | POA: Diagnosis not present

## 2018-07-21 DIAGNOSIS — M1611 Unilateral primary osteoarthritis, right hip: Secondary | ICD-10-CM | POA: Diagnosis not present

## 2018-07-21 DIAGNOSIS — M25551 Pain in right hip: Secondary | ICD-10-CM | POA: Diagnosis not present

## 2018-07-21 DIAGNOSIS — M353 Polymyalgia rheumatica: Secondary | ICD-10-CM | POA: Diagnosis not present

## 2018-08-03 DIAGNOSIS — R7982 Elevated C-reactive protein (CRP): Secondary | ICD-10-CM | POA: Diagnosis not present

## 2018-08-03 DIAGNOSIS — R700 Elevated erythrocyte sedimentation rate: Secondary | ICD-10-CM | POA: Diagnosis not present

## 2018-08-03 DIAGNOSIS — M1990 Unspecified osteoarthritis, unspecified site: Secondary | ICD-10-CM | POA: Diagnosis not present

## 2018-08-03 DIAGNOSIS — M5030 Other cervical disc degeneration, unspecified cervical region: Secondary | ICD-10-CM | POA: Diagnosis not present

## 2018-08-03 DIAGNOSIS — M353 Polymyalgia rheumatica: Secondary | ICD-10-CM | POA: Diagnosis not present

## 2018-08-04 DIAGNOSIS — H04123 Dry eye syndrome of bilateral lacrimal glands: Secondary | ICD-10-CM | POA: Diagnosis not present

## 2018-08-04 DIAGNOSIS — H52203 Unspecified astigmatism, bilateral: Secondary | ICD-10-CM | POA: Diagnosis not present

## 2018-08-04 DIAGNOSIS — Z961 Presence of intraocular lens: Secondary | ICD-10-CM | POA: Diagnosis not present

## 2018-08-04 DIAGNOSIS — H35373 Puckering of macula, bilateral: Secondary | ICD-10-CM | POA: Diagnosis not present

## 2018-08-18 DIAGNOSIS — R700 Elevated erythrocyte sedimentation rate: Secondary | ICD-10-CM | POA: Diagnosis not present

## 2018-08-18 DIAGNOSIS — M1990 Unspecified osteoarthritis, unspecified site: Secondary | ICD-10-CM | POA: Diagnosis not present

## 2018-08-18 DIAGNOSIS — M5030 Other cervical disc degeneration, unspecified cervical region: Secondary | ICD-10-CM | POA: Diagnosis not present

## 2018-08-18 DIAGNOSIS — R7982 Elevated C-reactive protein (CRP): Secondary | ICD-10-CM | POA: Diagnosis not present

## 2018-08-18 DIAGNOSIS — M353 Polymyalgia rheumatica: Secondary | ICD-10-CM | POA: Diagnosis not present

## 2018-09-15 ENCOUNTER — Encounter: Payer: Self-pay | Admitting: *Deleted

## 2018-09-15 ENCOUNTER — Other Ambulatory Visit: Payer: Self-pay | Admitting: Cardiology

## 2018-09-15 DIAGNOSIS — R52 Pain, unspecified: Secondary | ICD-10-CM | POA: Insufficient documentation

## 2018-09-15 DIAGNOSIS — M353 Polymyalgia rheumatica: Secondary | ICD-10-CM | POA: Diagnosis not present

## 2018-09-15 DIAGNOSIS — R7982 Elevated C-reactive protein (CRP): Secondary | ICD-10-CM | POA: Diagnosis not present

## 2018-09-15 DIAGNOSIS — Z780 Asymptomatic menopausal state: Secondary | ICD-10-CM | POA: Insufficient documentation

## 2018-09-15 DIAGNOSIS — I428 Other cardiomyopathies: Secondary | ICD-10-CM

## 2018-09-15 DIAGNOSIS — M1990 Unspecified osteoarthritis, unspecified site: Secondary | ICD-10-CM | POA: Diagnosis not present

## 2018-09-15 DIAGNOSIS — R700 Elevated erythrocyte sedimentation rate: Secondary | ICD-10-CM | POA: Diagnosis not present

## 2018-09-15 DIAGNOSIS — N644 Mastodynia: Secondary | ICD-10-CM | POA: Insufficient documentation

## 2018-09-15 DIAGNOSIS — M5030 Other cervical disc degeneration, unspecified cervical region: Secondary | ICD-10-CM | POA: Diagnosis not present

## 2018-10-07 DIAGNOSIS — R69 Illness, unspecified: Secondary | ICD-10-CM | POA: Diagnosis not present

## 2018-10-17 ENCOUNTER — Inpatient Hospital Stay: Admission: RE | Admit: 2018-10-17 | Payer: Medicare HMO | Source: Ambulatory Visit

## 2018-10-21 DIAGNOSIS — L720 Epidermal cyst: Secondary | ICD-10-CM | POA: Diagnosis not present

## 2018-11-08 ENCOUNTER — Ambulatory Visit (INDEPENDENT_AMBULATORY_CARE_PROVIDER_SITE_OTHER)
Admission: RE | Admit: 2018-11-08 | Discharge: 2018-11-08 | Disposition: A | Discharge status: Home / Self Care | Payer: Medicare HMO | Source: Ambulatory Visit | Attending: Cardiology | Admitting: Cardiology

## 2018-11-08 DIAGNOSIS — R918 Other nonspecific abnormal finding of lung field: Secondary | ICD-10-CM

## 2018-11-09 ENCOUNTER — Telehealth: Payer: Self-pay

## 2018-11-09 NOTE — Telephone Encounter (Signed)
Patient called, returning your call regarding CT results. Advised with the patient the CT results, and the need for the repeat Chest CT in 3 months and why. Patient was thankful for the call. Had no questions.

## 2018-11-11 ENCOUNTER — Telehealth: Payer: Self-pay | Admitting: Cardiology

## 2018-11-11 NOTE — Telephone Encounter (Signed)
Patient called requesting copy of CT, mailed as requested

## 2018-11-11 NOTE — Telephone Encounter (Signed)
New Message   Patient states she has questions about recent CT and wants to speak to a nurse.

## 2018-12-02 DIAGNOSIS — I10 Essential (primary) hypertension: Secondary | ICD-10-CM | POA: Diagnosis not present

## 2018-12-02 DIAGNOSIS — R918 Other nonspecific abnormal finding of lung field: Secondary | ICD-10-CM | POA: Diagnosis not present

## 2018-12-02 DIAGNOSIS — M353 Polymyalgia rheumatica: Secondary | ICD-10-CM | POA: Diagnosis not present

## 2019-02-07 ENCOUNTER — Telehealth: Payer: Self-pay

## 2019-02-07 NOTE — Telephone Encounter (Signed)
Virtual Visit Pre-Appointment Phone Call  "(Name), I am calling you today to discuss your upcoming appointment. We are currently trying to limit exposure to the virus that causes COVID-19 by seeing patients at home rather than in the office."  1. "What is the BEST phone number to call the day of the visit?" - include this in appointment notes  2. "Do you have or have access to (through a family member/friend) a smartphone with video capability that we can use for your visit?" a. If yes - list this number in appt notes as "cell" (if different from BEST phone #) and list the appointment type as a VIDEO visit in appointment notes b. If no - list the appointment type as a PHONE visit in appointment notes  3. Confirm consent - "In the setting of the current Covid19 crisis, you are scheduled for a TELEPHONE visit with your provider on 02/10/2019 at 8:20AM.  Just as we do with many in-office visits, in order for you to participate in this visit, we must obtain consent.  If you'd like, I can send this to your mychart (if signed up) or email for you to review.  Otherwise, I can obtain your verbal consent now.  All virtual visits are billed to your insurance company just like a normal visit would be.  By agreeing to a virtual visit, we'd like you to understand that the technology does not allow for your provider to perform an examination, and thus may limit your provider's ability to fully assess your condition. If your provider identifies any concerns that need to be evaluated in person, we will make arrangements to do so.  Finally, though the technology is pretty good, we cannot assure that it will always work on either your or our end, and in the setting of a video visit, we may have to convert it to a phone-only visit.  In either situation, we cannot ensure that we have a secure connection.  Are you willing to proceed?" STAFF: Did the patient verbally acknowledge consent to telehealth visit? Document  YES/NO here: YES  4. Advise patient to be prepared - "Two hours prior to your appointment, go ahead and check your blood pressure, pulse, oxygen saturation, and your weight (if you have the equipment to check those) and write them all down. When your visit starts, your provider will ask you for this information. If you have an Apple Watch or Kardia device, please plan to have heart rate information ready on the day of your appointment. Please have a pen and paper handy nearby the day of the visit as well."  5. Give patient instructions for MyChart download to smartphone OR Doximity/Doxy.me as below if video visit (depending on what platform provider is using)  6. Inform patient they will receive a phone call 15 minutes prior to their appointment time (may be from unknown caller ID) so they should be prepared to answer    TELEPHONE CALL NOTE  Katie Vasquez has been deemed a candidate for a follow-up tele-health visit to limit community exposure during the Covid-19 pandemic. I spoke with the patient via phone to ensure availability of phone/video source, confirm preferred email & phone number, and discuss instructions and expectations.  I reminded Katie Vasquez to be prepared with any vital sign and/or heart rhythm information that could potentially be obtained via home monitoring, at the time of her visit. I reminded Katie Vasquez to expect a phone call prior to her visit.  [name], [place] 02/07/2019 12:23 PM   INSTRUCTIONS FOR DOWNLOADING THE MYCHART APP TO SMARTPHONE  - The patient must first make sure to have activated MyChart and know their login information - If Apple, go to [employer] and type in MyChart in the search bar and download the app. If Android, ask patient to go to [employer] and type in [place] in the search bar and download the app. The app is free but as with any other app downloads, their phone may require them to verify saved payment information or Apple/Android  password.  - The patient will need to then log into the app with their MyChart username and password, and select [place] as their healthcare provider to link the account. When it is time for your visit, go to the MyChart app, find appointments, and click Begin Video Visit. Be sure to Select Allow for your device to access the Microphone and Camera for your visit. You will then be connected, and your provider will be with you shortly.  **If they have any issues connecting, or need assistance please contact MyChart service desk [phone])**  **If using a computer, in order to ensure the best quality for their visit they will need to use either of the following Internet Browsers: [employer], or Google Chrome**  IF USING DOXIMITY or DOXY.ME - The patient will receive a link just prior to their visit by text.     FULL LENGTH CONSENT FOR TELE-HEALTH VISIT   I hereby voluntarily request, consent and authorize [facility] and its employed or contracted physicians, physician assistants, nurse practitioners or other licensed health care professionals (the Practitioner), to provide me with telemedicine health care services (the "Services") as deemed necessary by the treating Practitioner. I acknowledge and consent to receive the Services by the Practitioner via telemedicine. I understand that the telemedicine visit will involve communicating with the Practitioner through live audiovisual communication technology and the disclosure of certain medical information by electronic transmission. I acknowledge that I have been given the opportunity to request an in-person assessment or other available alternative prior to the telemedicine visit and am voluntarily participating in the telemedicine visit.  I understand that I have the right to withhold or withdraw my consent to the use of telemedicine in the course of my care at any time, without affecting my right to future care or treatment,  and that the Practitioner or I may terminate the telemedicine visit at any time. I understand that I have the right to inspect all information obtained and/or recorded in the course of the telemedicine visit and may receive copies of available information for a reasonable fee.  I understand that some of the potential risks of receiving the Services via telemedicine include:  Marland Kitchen Delay or interruption in medical evaluation due to technological equipment failure or disruption; . Information transmitted may not be sufficient (e.g. poor resolution of images) to allow for appropriate medical decision making by the Practitioner; and/or  . In rare instances, security protocols could fail, causing a breach of personal health information.  Furthermore, I acknowledge that it is my responsibility to provide information about my medical history, conditions and care that is complete and accurate to the best of my ability. I acknowledge that Practitioner's advice, recommendations, and/or decision may be based on factors not within their control, such as incomplete or inaccurate data provided by me or distortions of diagnostic images or specimens that may result from electronic transmissions. I understand that the  practice of medicine is not an Chief Strategy Officer and that Practitioner makes no warranties or guarantees regarding treatment outcomes. I acknowledge that I will receive a copy of this consent concurrently upon execution via email to the email address I last provided but may also request a printed copy by calling the office of [facility].    I understand that my insurance will be billed for this visit.   I have read or had this consent read to me. . I understand the contents of this consent, which adequately explains the benefits and risks of the Services being provided via telemedicine.  . I have been provided ample opportunity to ask questions regarding this consent and the Services and have had my questions  answered to my satisfaction. . I give my informed consent for the services to be provided through the use of telemedicine in my medical care  By participating in this telemedicine visit I agree to the above.

## 2019-02-09 ENCOUNTER — Telehealth: Payer: Self-pay | Admitting: Cardiology

## 2019-02-09 NOTE — Telephone Encounter (Signed)
No Smart phone/Declined MyChart/Consent for phone visit. 02-09-19 ST
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
## 2019-02-10 ENCOUNTER — Telehealth (INDEPENDENT_AMBULATORY_CARE_PROVIDER_SITE_OTHER): Payer: Medicare HMO | Admitting: Cardiology

## 2019-02-10 ENCOUNTER — Other Ambulatory Visit: Payer: Self-pay | Admitting: Cardiology

## 2019-02-10 ENCOUNTER — Encounter: Payer: Self-pay | Admitting: Cardiology

## 2019-02-10 VITALS — BP 142/76 | HR 83 | Ht 64.0 in | Wt 146.0 lb

## 2019-02-10 DIAGNOSIS — R911 Solitary pulmonary nodule: Secondary | ICD-10-CM

## 2019-02-10 DIAGNOSIS — I10 Essential (primary) hypertension: Secondary | ICD-10-CM

## 2019-02-10 DIAGNOSIS — I420 Dilated cardiomyopathy: Secondary | ICD-10-CM

## 2019-02-10 MED ORDER — LISINOPRIL 40 MG PO TABS: 40.0000 mg | ORAL_TABLET | Freq: Every day | ORAL | 3 refills | Status: DC

## 2019-02-10 NOTE — Patient Instructions (Signed)
Medication Instructions:  STOP HCTZ  INCREASE LISINOPRIL TO 40 MG ONCE DAILY= 2 OF THE 20 MG TABLETS ONCE DAILY If you need a refill on your cardiac medications before your next appointment, please call your pharmacy.   Lab work: Your physician recommends that you return for lab work in: [place] If you have labs (blood work) drawn today and your tests are completely normal, you will receive your results only by: Marland Kitchen MyChart Message (if you have MyChart) OR . A paper copy in the mail If you have any lab test that is abnormal or we need to change your treatment, we will call you to review the results.  Testing/Procedures: CT OF THE CHEST TO FOLLOW UPON LUNG NODULE=[address]=[facility]  Follow-Up: At [facility], you and your health needs are our priority.  As part of our continuing mission to provide you with exceptional heart care, we have created designated Provider Care Teams.  These Care Teams include your primary Cardiologist (physician) and Advanced Practice Providers (APPs -  Physician Assistants and Nurse Practitioners) who all work together to provide you with the care you need, when you need it. Your physician recommends that you schedule a follow-up appointment in: [place]

## 2019-02-13 ENCOUNTER — Ambulatory Visit
Admission: RE | Admit: 2019-02-13 | Discharge: 2019-02-13 | Disposition: A | Discharge status: Home / Self Care | Payer: Medicare HMO | Source: Ambulatory Visit | Attending: Cardiology | Admitting: Cardiology

## 2019-02-13 ENCOUNTER — Other Ambulatory Visit: Payer: Self-pay

## 2019-02-13 DIAGNOSIS — R911 Solitary pulmonary nodule: Secondary | ICD-10-CM

## 2019-02-17 ENCOUNTER — Encounter: Payer: Self-pay | Admitting: *Deleted

## 2019-02-17 DIAGNOSIS — I10 Essential (primary) hypertension: Secondary | ICD-10-CM | POA: Diagnosis not present

## 2019-02-17 LAB — BASIC METABOLIC PANEL
BUN/Creatinine Ratio: 18 (ref 12–28)
BUN: 18 mg/dL (ref 8–27)
CO2: 24 mmol/L (ref 20–29)
Calcium: 9.8 mg/dL (ref 8.7–10.3)
Chloride: 103 mmol/L (ref 96–106)
Creatinine, Ser: 1 mg/dL (ref 0.57–1.00)
GFR calc Af Amer: 62 mL/min/{1.73_m2} (ref >59)
GFR calc non Af Amer: 54 mL/min/{1.73_m2} — ABNORMAL LOW (ref >59)
Glucose: 79 mg/dL (ref 65–99)
Potassium: 4.8 mmol/L (ref 3.5–5.2)
Sodium: 142 mmol/L (ref 134–144)

## 2019-03-16 DIAGNOSIS — K591 Functional diarrhea: Secondary | ICD-10-CM | POA: Diagnosis not present

## 2019-03-16 DIAGNOSIS — I10 Essential (primary) hypertension: Secondary | ICD-10-CM | POA: Diagnosis not present

## 2019-04-05 IMAGING — CT CT ANGIO CHEST-ABD-PELV FOR DISSECTION W/ AND WO/W CM
2 of 10 series · 14 of 46 positions shown, 16 images · IV contrast (OMNI)
Comparison: Same day CXR

CLINICAL DATA: Complains of chest pressure since 6726 hours this
morning.

EXAM:
CT ANGIOGRAPHY CHEST, ABDOMEN AND PELVIS
TECHNIQUE: Multidetector CT imaging through the chest, abdomen and pelvis was
performed using the standard protocol during bolus administration of
intravenous contrast. Multiplanar reconstructed images and MIPs were
obtained and reviewed to evaluate the vascular anatomy.
CONTRAST:  100mL YKRVM0-ILE IOPAMIDOL (YKRVM0-ILE) INJECTION 76%

[Series 7: dissection 3.0 i30f 3 · axial · 0.89mm/px · z∈[+742,+1264]mm · 11 of 202 slices shown, 13 images]
[im 14/202  soft-tissue]
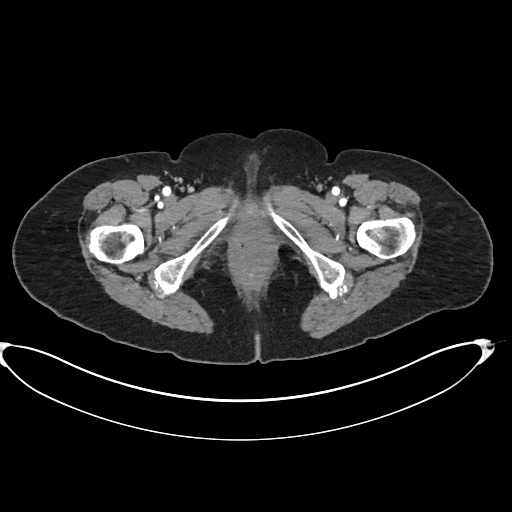
[im 14/202  bone]
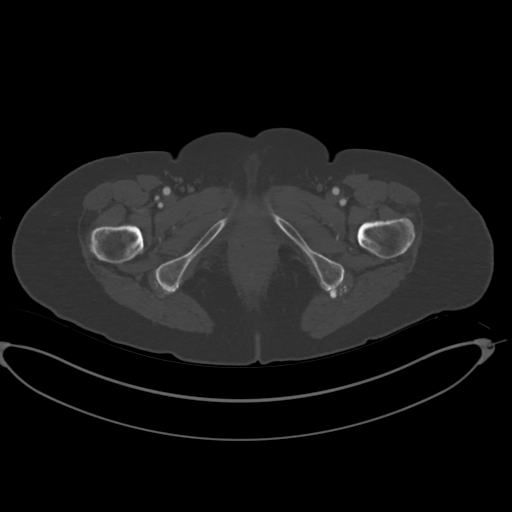
[im 27/202  soft-tissue]
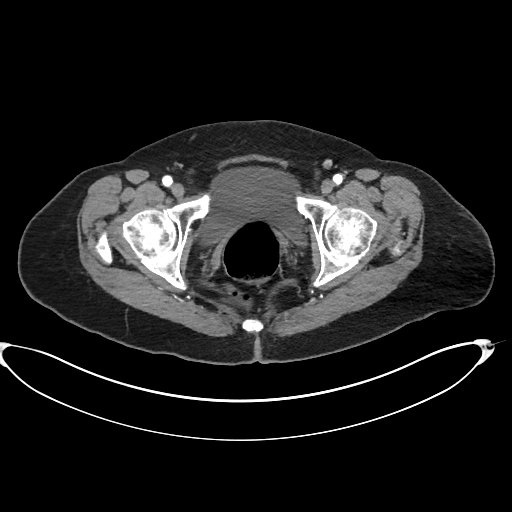
[im 54/202  soft-tissue]
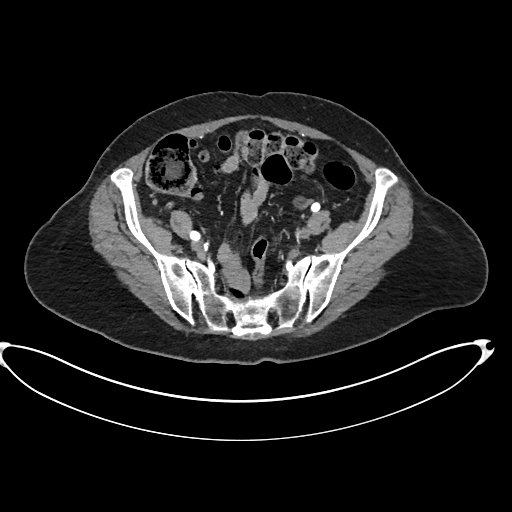
[im 68/202  soft-tissue]
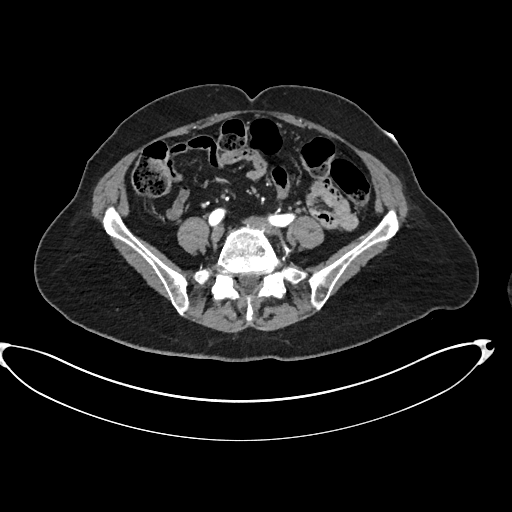
[im 81/202  soft-tissue]
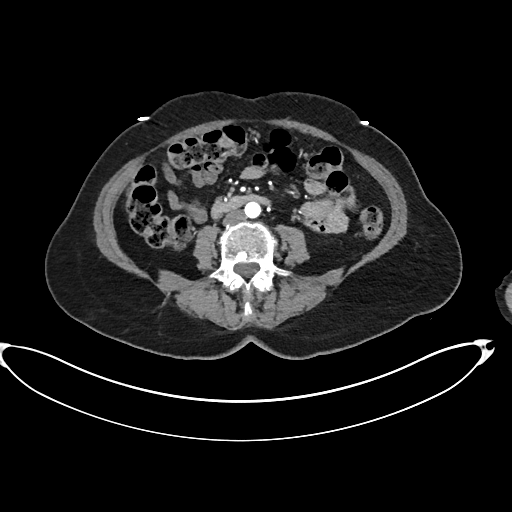
[im 108/202  soft-tissue]
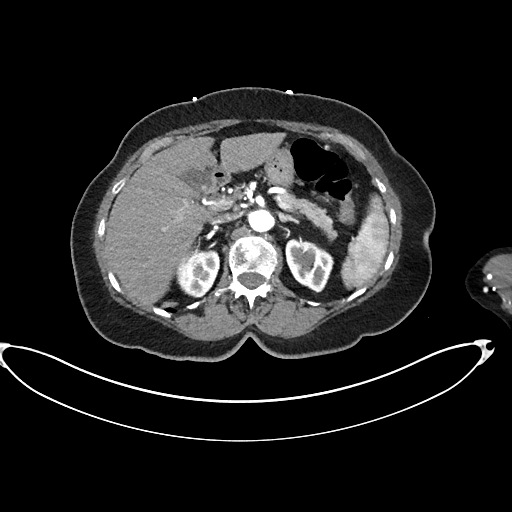
[im 121/202  soft-tissue]
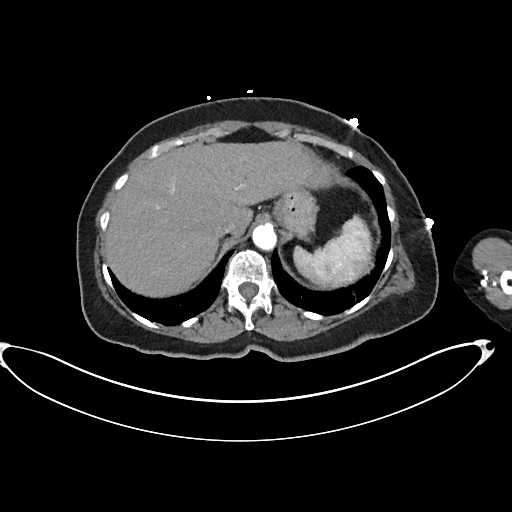
[im 135/202  soft-tissue]
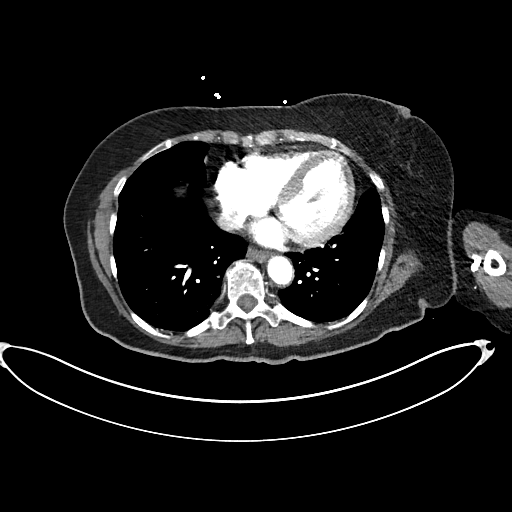
[im 148/202  soft-tissue]
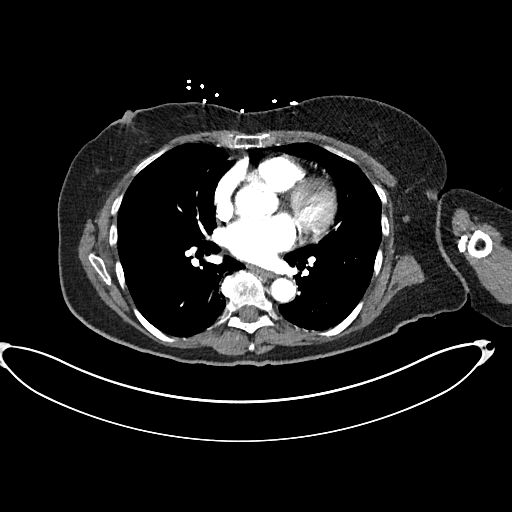
[im 148/202  bone]
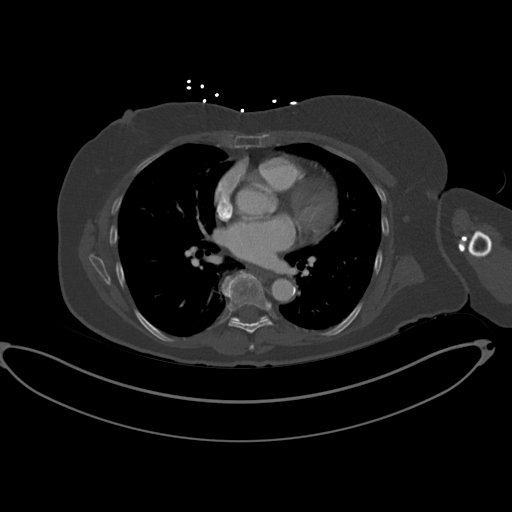
[im 175/202  soft-tissue]
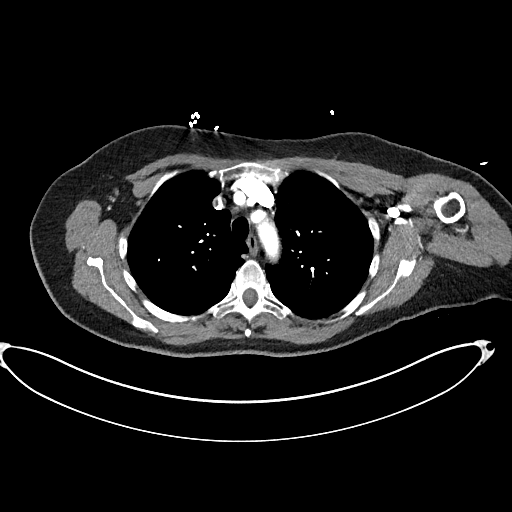
[im 188/202  soft-tissue]
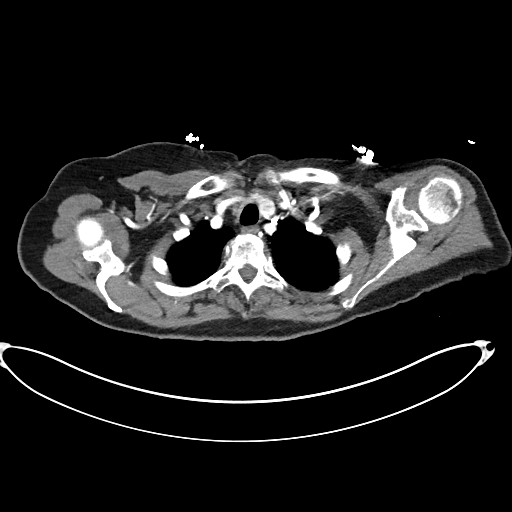

[Series 10: coronals · coronal · 0.88mm/px · 3 of 147 slices shown]
[im 37/147  soft-tissue]
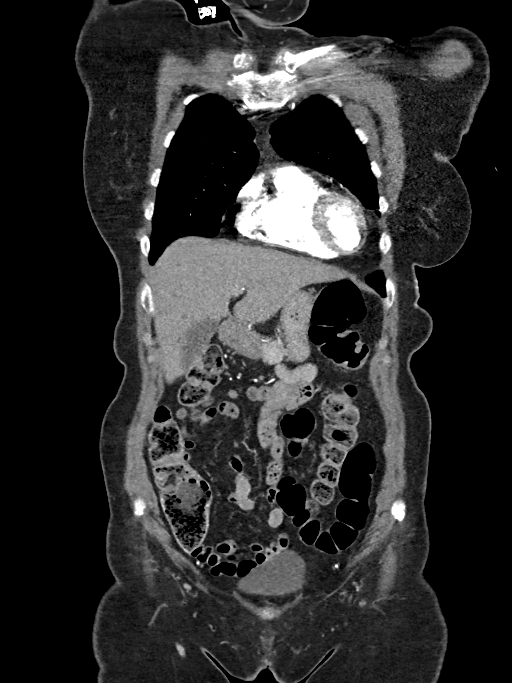
[im 74/147  soft-tissue]
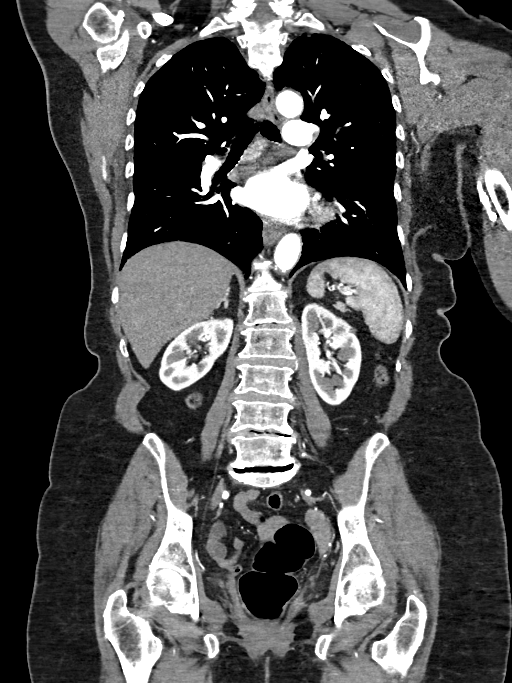
[im 110/147  soft-tissue]
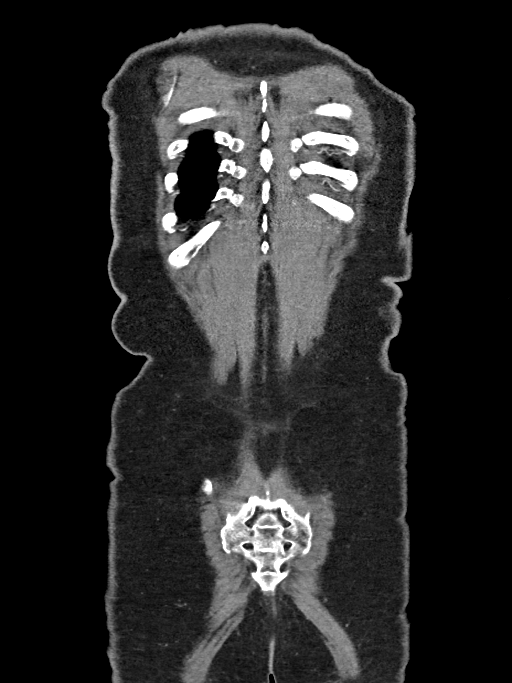

[14 of 46 positions shown; findings below may reference images not displayed]

FINDINGS: CTA CHEST FINDINGS

Cardiovascular: Normal size heart without pericardial effusion.
Minimal coronary arteriosclerosis along the LAD. No aortic aneurysm
or dissection. Mild atherosclerosis of the aortic arch and
descending aorta. Normal branch pattern of the great vessels. No
pulmonary embolus.

Mediastinum/Nodes: No enlarged mediastinal, hilar, or axillary lymph
nodes. Thyroid gland, trachea, and esophagus demonstrate no
significant findings.

Lungs/Pleura: Lungs are clear. No pleural effusion or pneumothorax.
4 mm left upper lobe noncalcified pulmonary nodule.

Musculoskeletal: No acute osseous abnormality. Thoracolumbar
spondylosis. Small posterior osteophytes at T7-8 touching upon the
ventral aspect of the thecal sac..

Review of the MIP images confirms the above findings.

CTA ABDOMEN AND PELVIS FINDINGS

VASCULAR

Aorta: Normal caliber abdominal aorta without dissection or
aneurysm.

Celiac: The common hepatic artery has its own takeoff from the
abdominal aorta. Remainder of the celiac trunk is unremarkable. No
significant stenosis, occlusion or aneurysm.

SMA: Patent without aneurysm or dissection.

Renals: Patent single renal arteries bilaterally. No evidence of
aneurysm, dissection, or fibromuscular dysplasia.

IMA: Patent without evidence of aneurysm, dissection, vasculitis or
significant stenosis.

Inflow: No aneurysm, dissection nor significant stenosis common
iliac arteries and branch vessels. Common femoral arteries are
widely patent bilaterally.

Veins: No obvious venous abnormality within the limitations of this
arterial phase study.

Review of the MIP images confirms the above findings.

NON-VASCULAR

Hepatobiliary: 11 mm hypodensity in the caudate statistically
consistent with a cyst. No biliary dilatation. Normal gallbladder
without stones.

Pancreas: Normal

Spleen: Normal

Adrenals/Urinary Tract: Normal

Stomach/Bowel: Stomach is within normal limits. Appendix appears
normal. No evidence of bowel wall thickening, distention, or
inflammatory changes.

Lymphatic: No adenopathy

Reproductive: Status post hysterectomy. No adnexal masses.

Other: No abdominal wall hernia or abnormality. No abdominopelvic
ascites.

Musculoskeletal: Lumbar spondylosis with degenerative disc disease
and vacuum disc phenomenon at L4-5 and L5-S1.

Review of the MIP images confirms the above findings.
IMPRESSION: 1. Minimal coronary arteriosclerosis along the LAD.
2. Mild thoracic aortic atherosclerosis without aneurysm or
dissection.
3. No acute pulmonary embolus.
4. 4 mm nodule in the left upper lobe. No follow-up needed if
patient is low-risk. Non-contrast chest CT can be considered in 12
months if patient is high-risk. This recommendation follows the
consensus statement: Guidelines for Management of Incidental
Pulmonary Nodules Detected on CT Images: From the [HOSPITAL]
5. Minimal abdominal aortic atherosclerosis without aneurysm or
dissection. Patent branch vessels.
6. 1 cm cyst in the caudate.
7. Thoracolumbar spondylosis.

## 2019-04-14 DIAGNOSIS — M25511 Pain in right shoulder: Secondary | ICD-10-CM | POA: Diagnosis not present

## 2019-04-17 DIAGNOSIS — R200 Anesthesia of skin: Secondary | ICD-10-CM | POA: Diagnosis not present

## 2019-04-17 DIAGNOSIS — M79671 Pain in right foot: Secondary | ICD-10-CM | POA: Diagnosis not present

## 2019-04-19 DIAGNOSIS — M65871 Other synovitis and tenosynovitis, right ankle and foot: Secondary | ICD-10-CM | POA: Diagnosis not present

## 2019-04-19 DIAGNOSIS — M76811 Anterior tibial syndrome, right leg: Secondary | ICD-10-CM | POA: Diagnosis not present

## 2019-04-19 DIAGNOSIS — M79671 Pain in right foot: Secondary | ICD-10-CM | POA: Diagnosis not present

## 2019-04-24 DIAGNOSIS — R69 Illness, unspecified: Secondary | ICD-10-CM | POA: Diagnosis not present

## 2019-05-05 DIAGNOSIS — M25511 Pain in right shoulder: Secondary | ICD-10-CM | POA: Diagnosis not present

## 2019-05-10 DIAGNOSIS — M353 Polymyalgia rheumatica: Secondary | ICD-10-CM | POA: Diagnosis not present

## 2019-05-10 DIAGNOSIS — M25511 Pain in right shoulder: Secondary | ICD-10-CM | POA: Diagnosis not present

## 2019-05-10 DIAGNOSIS — M4802 Spinal stenosis, cervical region: Secondary | ICD-10-CM | POA: Diagnosis not present

## 2019-05-10 DIAGNOSIS — M75101 Unspecified rotator cuff tear or rupture of right shoulder, not specified as traumatic: Secondary | ICD-10-CM | POA: Diagnosis not present

## 2019-05-23 DIAGNOSIS — M79671 Pain in right foot: Secondary | ICD-10-CM | POA: Diagnosis not present

## 2019-05-23 DIAGNOSIS — M76811 Anterior tibial syndrome, right leg: Secondary | ICD-10-CM | POA: Diagnosis not present

## 2019-05-23 DIAGNOSIS — M65871 Other synovitis and tenosynovitis, right ankle and foot: Secondary | ICD-10-CM | POA: Diagnosis not present
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
## 2019-05-30 ENCOUNTER — Ambulatory Visit: Payer: Medicare HMO | Admitting: Cardiology

## 2019-06-16 DIAGNOSIS — Z0000 Encounter for general adult medical examination without abnormal findings: Secondary | ICD-10-CM | POA: Diagnosis not present

## 2019-06-16 DIAGNOSIS — I700 Atherosclerosis of aorta: Secondary | ICD-10-CM | POA: Diagnosis not present

## 2019-06-16 DIAGNOSIS — Z1389 Encounter for screening for other disorder: Secondary | ICD-10-CM | POA: Diagnosis not present

## 2019-06-16 DIAGNOSIS — I10 Essential (primary) hypertension: Secondary | ICD-10-CM | POA: Diagnosis not present

## 2019-06-16 DIAGNOSIS — M353 Polymyalgia rheumatica: Secondary | ICD-10-CM | POA: Diagnosis not present

## 2019-06-16 DIAGNOSIS — I5042 Chronic combined systolic (congestive) and diastolic (congestive) heart failure: Secondary | ICD-10-CM | POA: Diagnosis not present

## 2019-06-19 DIAGNOSIS — B351 Tinea unguium: Secondary | ICD-10-CM | POA: Diagnosis not present

## 2019-06-19 DIAGNOSIS — R600 Localized edema: Secondary | ICD-10-CM | POA: Diagnosis not present

## 2019-06-19 DIAGNOSIS — M79661 Pain in right lower leg: Secondary | ICD-10-CM | POA: Diagnosis not present

## 2019-07-05 DIAGNOSIS — S60221A Contusion of right hand, initial encounter: Secondary | ICD-10-CM | POA: Diagnosis not present

## 2019-07-05 DIAGNOSIS — M79641 Pain in right hand: Secondary | ICD-10-CM | POA: Diagnosis not present

## 2019-07-07 DIAGNOSIS — G5601 Carpal tunnel syndrome, right upper limb: Secondary | ICD-10-CM | POA: Diagnosis not present

## 2019-07-07 DIAGNOSIS — S60221D Contusion of right hand, subsequent encounter: Secondary | ICD-10-CM | POA: Diagnosis not present

## 2019-07-07 DIAGNOSIS — M79641 Pain in right hand: Secondary | ICD-10-CM | POA: Diagnosis not present

## 2019-07-07 DIAGNOSIS — S60221A Contusion of right hand, initial encounter: Secondary | ICD-10-CM | POA: Diagnosis not present

## 2019-07-17 DIAGNOSIS — M79661 Pain in right lower leg: Secondary | ICD-10-CM | POA: Diagnosis not present

## 2019-07-17 DIAGNOSIS — B351 Tinea unguium: Secondary | ICD-10-CM | POA: Diagnosis not present

## 2019-07-17 DIAGNOSIS — M65871 Other synovitis and tenosynovitis, right ankle and foot: Secondary | ICD-10-CM | POA: Diagnosis not present

## 2019-07-17 DIAGNOSIS — R600 Localized edema: Secondary | ICD-10-CM | POA: Diagnosis not present

## 2019-07-19 DIAGNOSIS — S60221D Contusion of right hand, subsequent encounter: Secondary | ICD-10-CM | POA: Diagnosis not present

## 2019-07-19 DIAGNOSIS — M79641 Pain in right hand: Secondary | ICD-10-CM | POA: Diagnosis not present

## 2019-07-26 DIAGNOSIS — R69 Illness, unspecified: Secondary | ICD-10-CM | POA: Diagnosis not present

## 2019-07-31 DIAGNOSIS — G5601 Carpal tunnel syndrome, right upper limb: Secondary | ICD-10-CM | POA: Diagnosis not present

## 2019-08-02 DIAGNOSIS — R69 Illness, unspecified: Secondary | ICD-10-CM | POA: Diagnosis not present

## 2019-08-02 DIAGNOSIS — C411 Malignant neoplasm of mandible: Secondary | ICD-10-CM | POA: Diagnosis not present

## 2019-08-04 DIAGNOSIS — G5601 Carpal tunnel syndrome, right upper limb: Secondary | ICD-10-CM | POA: Diagnosis not present

## 2019-08-10 DIAGNOSIS — H31001 Unspecified chorioretinal scars, right eye: Secondary | ICD-10-CM | POA: Diagnosis not present

## 2019-08-10 DIAGNOSIS — H52203 Unspecified astigmatism, bilateral: Secondary | ICD-10-CM | POA: Diagnosis not present

## 2019-08-10 DIAGNOSIS — H35373 Puckering of macula, bilateral: Secondary | ICD-10-CM | POA: Diagnosis not present

## 2019-08-10 DIAGNOSIS — H524 Presbyopia: Secondary | ICD-10-CM | POA: Diagnosis not present

## 2019-08-24 DIAGNOSIS — Z96652 Presence of left artificial knee joint: Secondary | ICD-10-CM | POA: Diagnosis not present

## 2019-08-24 DIAGNOSIS — M25562 Pain in left knee: Secondary | ICD-10-CM | POA: Diagnosis not present

## 2019-08-25 DIAGNOSIS — G5601 Carpal tunnel syndrome, right upper limb: Secondary | ICD-10-CM | POA: Diagnosis not present

## 2019-08-25 DIAGNOSIS — M19041 Primary osteoarthritis, right hand: Secondary | ICD-10-CM | POA: Diagnosis not present

## 2019-09-05 ENCOUNTER — Telehealth: Payer: Self-pay | Admitting: *Deleted

## 2019-09-05 NOTE — Telephone Encounter (Signed)
Spoke with pt, she does not want to do the CT scan at this time and would like to put it off for about 6 months. She will contact me once she is ready to schedule.

## 2019-09-05 NOTE — Telephone Encounter (Signed)
Left message for pt to call, she needs a CT wo to follow up on lung nodule.

## 2019-09-15 DIAGNOSIS — G5601 Carpal tunnel syndrome, right upper limb: Secondary | ICD-10-CM | POA: Diagnosis not present

## 2019-10-05 DIAGNOSIS — A0839 Other viral enteritis: Secondary | ICD-10-CM | POA: Diagnosis not present

## 2019-10-20 DIAGNOSIS — M542 Cervicalgia: Secondary | ICD-10-CM | POA: Diagnosis not present

## 2019-10-20 DIAGNOSIS — M5030 Other cervical disc degeneration, unspecified cervical region: Secondary | ICD-10-CM | POA: Diagnosis not present

## 2019-10-27 DIAGNOSIS — I10 Essential (primary) hypertension: Secondary | ICD-10-CM | POA: Diagnosis not present

## 2019-10-27 DIAGNOSIS — R609 Edema, unspecified: Secondary | ICD-10-CM | POA: Diagnosis not present

## 2019-12-21 DIAGNOSIS — I10 Essential (primary) hypertension: Secondary | ICD-10-CM | POA: Diagnosis not present

## 2019-12-21 DIAGNOSIS — I420 Dilated cardiomyopathy: Secondary | ICD-10-CM | POA: Diagnosis not present

## 2019-12-21 DIAGNOSIS — L989 Disorder of the skin and subcutaneous tissue, unspecified: Secondary | ICD-10-CM | POA: Diagnosis not present

## 2020-01-11 DIAGNOSIS — I10 Essential (primary) hypertension: Secondary | ICD-10-CM | POA: Diagnosis not present

## 2020-01-11 DIAGNOSIS — I5042 Chronic combined systolic (congestive) and diastolic (congestive) heart failure: Secondary | ICD-10-CM | POA: Diagnosis not present

## 2020-02-21 DIAGNOSIS — M79675 Pain in left toe(s): Secondary | ICD-10-CM | POA: Diagnosis not present

## 2020-02-21 DIAGNOSIS — M19072 Primary osteoarthritis, left ankle and foot: Secondary | ICD-10-CM | POA: Diagnosis not present

## 2020-04-09 DIAGNOSIS — R102 Pelvic and perineal pain: Secondary | ICD-10-CM | POA: Diagnosis not present

## 2020-05-23 DIAGNOSIS — M25562 Pain in left knee: Secondary | ICD-10-CM | POA: Diagnosis not present

## 2020-05-23 DIAGNOSIS — Z96652 Presence of left artificial knee joint: Secondary | ICD-10-CM | POA: Diagnosis not present

## 2020-05-29 DIAGNOSIS — M6281 Muscle weakness (generalized): Secondary | ICD-10-CM | POA: Diagnosis not present

## 2020-06-25 DIAGNOSIS — I700 Atherosclerosis of aorta: Secondary | ICD-10-CM | POA: Diagnosis not present

## 2020-06-25 DIAGNOSIS — Z1389 Encounter for screening for other disorder: Secondary | ICD-10-CM | POA: Diagnosis not present

## 2020-06-25 DIAGNOSIS — Z23 Encounter for immunization: Secondary | ICD-10-CM | POA: Diagnosis not present

## 2020-06-25 DIAGNOSIS — I10 Essential (primary) hypertension: Secondary | ICD-10-CM | POA: Diagnosis not present

## 2020-06-25 DIAGNOSIS — R69 Illness, unspecified: Secondary | ICD-10-CM | POA: Diagnosis not present

## 2020-06-25 DIAGNOSIS — M353 Polymyalgia rheumatica: Secondary | ICD-10-CM | POA: Diagnosis not present

## 2020-06-25 DIAGNOSIS — Z7189 Other specified counseling: Secondary | ICD-10-CM | POA: Diagnosis not present

## 2020-06-25 DIAGNOSIS — I5042 Chronic combined systolic (congestive) and diastolic (congestive) heart failure: Secondary | ICD-10-CM | POA: Diagnosis not present

## 2020-06-25 DIAGNOSIS — Z0000 Encounter for general adult medical examination without abnormal findings: Secondary | ICD-10-CM | POA: Diagnosis not present

## 2020-07-05 DIAGNOSIS — H01114 Allergic dermatitis of left upper eyelid: Secondary | ICD-10-CM | POA: Diagnosis not present

## 2020-07-11 DIAGNOSIS — R69 Illness, unspecified: Secondary | ICD-10-CM | POA: Diagnosis not present

## 2020-07-12 DIAGNOSIS — R69 Illness, unspecified: Secondary | ICD-10-CM | POA: Diagnosis not present

## 2020-07-17 DIAGNOSIS — M25561 Pain in right knee: Secondary | ICD-10-CM | POA: Diagnosis not present

## 2020-07-17 DIAGNOSIS — M2550 Pain in unspecified joint: Secondary | ICD-10-CM | POA: Diagnosis not present

## 2020-07-23 DIAGNOSIS — M25561 Pain in right knee: Secondary | ICD-10-CM | POA: Diagnosis not present

## 2020-07-26 DIAGNOSIS — Z77098 Contact with and (suspected) exposure to other hazardous, chiefly nonmedicinal, chemicals: Secondary | ICD-10-CM | POA: Diagnosis not present

## 2020-07-31 DIAGNOSIS — M25561 Pain in right knee: Secondary | ICD-10-CM | POA: Diagnosis not present

## 2020-07-31 DIAGNOSIS — M228X1 Other disorders of patella, right knee: Secondary | ICD-10-CM | POA: Diagnosis not present

## 2020-07-31 DIAGNOSIS — Z77098 Contact with and (suspected) exposure to other hazardous, chiefly nonmedicinal, chemicals: Secondary | ICD-10-CM | POA: Diagnosis not present

## 2020-08-08 DIAGNOSIS — R69 Illness, unspecified: Secondary | ICD-10-CM | POA: Diagnosis not present

## 2020-08-16 DIAGNOSIS — M25561 Pain in right knee: Secondary | ICD-10-CM | POA: Diagnosis not present

## 2020-08-20 DIAGNOSIS — M170 Bilateral primary osteoarthritis of knee: Secondary | ICD-10-CM | POA: Diagnosis not present

## 2020-08-20 DIAGNOSIS — M1711 Unilateral primary osteoarthritis, right knee: Secondary | ICD-10-CM | POA: Diagnosis not present

## 2020-08-20 DIAGNOSIS — M353 Polymyalgia rheumatica: Secondary | ICD-10-CM | POA: Diagnosis not present

## 2020-08-20 DIAGNOSIS — M4802 Spinal stenosis, cervical region: Secondary | ICD-10-CM | POA: Diagnosis not present

## 2020-08-20 DIAGNOSIS — M25561 Pain in right knee: Secondary | ICD-10-CM | POA: Diagnosis not present

## 2020-08-20 DIAGNOSIS — M1712 Unilateral primary osteoarthritis, left knee: Secondary | ICD-10-CM | POA: Diagnosis not present

## 2020-09-13 DIAGNOSIS — H35373 Puckering of macula, bilateral: Secondary | ICD-10-CM | POA: Diagnosis not present

## 2020-09-13 DIAGNOSIS — H04123 Dry eye syndrome of bilateral lacrimal glands: Secondary | ICD-10-CM | POA: Diagnosis not present

## 2020-09-13 DIAGNOSIS — Z961 Presence of intraocular lens: Secondary | ICD-10-CM | POA: Diagnosis not present

## 2020-09-13 DIAGNOSIS — H52203 Unspecified astigmatism, bilateral: Secondary | ICD-10-CM | POA: Diagnosis not present

## 2020-09-20 ENCOUNTER — Other Ambulatory Visit: Payer: Self-pay | Admitting: Internal Medicine

## 2020-09-20 DIAGNOSIS — R5381 Other malaise: Secondary | ICD-10-CM

## 2020-10-02 ENCOUNTER — Other Ambulatory Visit: Payer: Self-pay | Admitting: Internal Medicine

## 2020-10-02 DIAGNOSIS — M8580 Other specified disorders of bone density and structure, unspecified site: Secondary | ICD-10-CM

## 2020-11-13 DIAGNOSIS — M85852 Other specified disorders of bone density and structure, left thigh: Secondary | ICD-10-CM | POA: Diagnosis not present

## 2020-11-13 DIAGNOSIS — Z1231 Encounter for screening mammogram for malignant neoplasm of breast: Secondary | ICD-10-CM | POA: Diagnosis not present

## 2020-11-13 DIAGNOSIS — M85851 Other specified disorders of bone density and structure, right thigh: Secondary | ICD-10-CM | POA: Diagnosis not present

## 2020-11-13 DIAGNOSIS — Z780 Asymptomatic menopausal state: Secondary | ICD-10-CM | POA: Diagnosis not present

## 2020-11-14 DIAGNOSIS — M79642 Pain in left hand: Secondary | ICD-10-CM | POA: Diagnosis not present

## 2020-12-06 DIAGNOSIS — M13842 Other specified arthritis, left hand: Secondary | ICD-10-CM | POA: Diagnosis not present

## 2020-12-26 DIAGNOSIS — M2550 Pain in unspecified joint: Secondary | ICD-10-CM | POA: Diagnosis not present

## 2020-12-26 DIAGNOSIS — G5603 Carpal tunnel syndrome, bilateral upper limbs: Secondary | ICD-10-CM | POA: Diagnosis not present

## 2020-12-26 DIAGNOSIS — R0789 Other chest pain: Secondary | ICD-10-CM | POA: Diagnosis not present

## 2020-12-26 DIAGNOSIS — I10 Essential (primary) hypertension: Secondary | ICD-10-CM | POA: Diagnosis not present

## 2021-01-03 DIAGNOSIS — M13842 Other specified arthritis, left hand: Secondary | ICD-10-CM | POA: Diagnosis not present

## 2021-01-03 DIAGNOSIS — M19032 Primary osteoarthritis, left wrist: Secondary | ICD-10-CM | POA: Diagnosis not present

## 2021-01-09 DIAGNOSIS — R197 Diarrhea, unspecified: Secondary | ICD-10-CM | POA: Diagnosis not present

## 2021-01-22 DIAGNOSIS — M179 Osteoarthritis of knee, unspecified: Secondary | ICD-10-CM | POA: Diagnosis not present

## 2021-01-22 DIAGNOSIS — I10 Essential (primary) hypertension: Secondary | ICD-10-CM | POA: Diagnosis not present

## 2021-01-22 DIAGNOSIS — M1990 Unspecified osteoarthritis, unspecified site: Secondary | ICD-10-CM | POA: Diagnosis not present

## 2021-01-22 DIAGNOSIS — M8580 Other specified disorders of bone density and structure, unspecified site: Secondary | ICD-10-CM | POA: Diagnosis not present

## 2021-01-22 DIAGNOSIS — M1611 Unilateral primary osteoarthritis, right hip: Secondary | ICD-10-CM | POA: Diagnosis not present

## 2021-01-22 DIAGNOSIS — I5042 Chronic combined systolic (congestive) and diastolic (congestive) heart failure: Secondary | ICD-10-CM | POA: Diagnosis not present

## 2021-02-26 DIAGNOSIS — M1611 Unilateral primary osteoarthritis, right hip: Secondary | ICD-10-CM | POA: Diagnosis not present

## 2021-02-26 DIAGNOSIS — I10 Essential (primary) hypertension: Secondary | ICD-10-CM | POA: Diagnosis not present

## 2021-02-26 DIAGNOSIS — M1990 Unspecified osteoarthritis, unspecified site: Secondary | ICD-10-CM | POA: Diagnosis not present

## 2021-02-26 DIAGNOSIS — M8580 Other specified disorders of bone density and structure, unspecified site: Secondary | ICD-10-CM | POA: Diagnosis not present

## 2021-02-26 DIAGNOSIS — M179 Osteoarthritis of knee, unspecified: Secondary | ICD-10-CM | POA: Diagnosis not present

## 2021-02-26 DIAGNOSIS — I5042 Chronic combined systolic (congestive) and diastolic (congestive) heart failure: Secondary | ICD-10-CM | POA: Diagnosis not present

## 2021-03-04 DIAGNOSIS — M25512 Pain in left shoulder: Secondary | ICD-10-CM | POA: Diagnosis not present

## 2021-03-10 DIAGNOSIS — I10 Essential (primary) hypertension: Secondary | ICD-10-CM | POA: Diagnosis not present

## 2021-03-27 DIAGNOSIS — Z0000 Encounter for general adult medical examination without abnormal findings: Secondary | ICD-10-CM | POA: Diagnosis not present

## 2021-03-27 DIAGNOSIS — R799 Abnormal finding of blood chemistry, unspecified: Secondary | ICD-10-CM | POA: Diagnosis not present

## 2021-04-03 DIAGNOSIS — M8580 Other specified disorders of bone density and structure, unspecified site: Secondary | ICD-10-CM | POA: Diagnosis not present

## 2021-04-03 DIAGNOSIS — M179 Osteoarthritis of knee, unspecified: Secondary | ICD-10-CM | POA: Diagnosis not present

## 2021-04-03 DIAGNOSIS — I5042 Chronic combined systolic (congestive) and diastolic (congestive) heart failure: Secondary | ICD-10-CM | POA: Diagnosis not present

## 2021-04-03 DIAGNOSIS — M1611 Unilateral primary osteoarthritis, right hip: Secondary | ICD-10-CM | POA: Diagnosis not present

## 2021-04-03 DIAGNOSIS — I10 Essential (primary) hypertension: Secondary | ICD-10-CM | POA: Diagnosis not present

## 2021-04-03 DIAGNOSIS — M1990 Unspecified osteoarthritis, unspecified site: Secondary | ICD-10-CM | POA: Diagnosis not present

## 2021-04-04 DIAGNOSIS — M179 Osteoarthritis of knee, unspecified: Secondary | ICD-10-CM | POA: Diagnosis not present

## 2021-04-04 DIAGNOSIS — I5042 Chronic combined systolic (congestive) and diastolic (congestive) heart failure: Secondary | ICD-10-CM | POA: Diagnosis not present

## 2021-04-04 DIAGNOSIS — I10 Essential (primary) hypertension: Secondary | ICD-10-CM | POA: Diagnosis not present

## 2021-04-04 DIAGNOSIS — M1611 Unilateral primary osteoarthritis, right hip: Secondary | ICD-10-CM | POA: Diagnosis not present

## 2021-04-04 DIAGNOSIS — M8580 Other specified disorders of bone density and structure, unspecified site: Secondary | ICD-10-CM | POA: Diagnosis not present

## 2021-04-04 DIAGNOSIS — M1990 Unspecified osteoarthritis, unspecified site: Secondary | ICD-10-CM | POA: Diagnosis not present

## 2021-07-15 DIAGNOSIS — I700 Atherosclerosis of aorta: Secondary | ICD-10-CM | POA: Diagnosis not present

## 2021-07-15 DIAGNOSIS — I10 Essential (primary) hypertension: Secondary | ICD-10-CM | POA: Diagnosis not present

## 2021-07-15 DIAGNOSIS — J029 Acute pharyngitis, unspecified: Secondary | ICD-10-CM | POA: Diagnosis not present

## 2021-07-15 DIAGNOSIS — Z1389 Encounter for screening for other disorder: Secondary | ICD-10-CM | POA: Diagnosis not present

## 2021-07-15 DIAGNOSIS — D473 Essential (hemorrhagic) thrombocythemia: Secondary | ICD-10-CM | POA: Diagnosis not present

## 2021-07-15 DIAGNOSIS — Z23 Encounter for immunization: Secondary | ICD-10-CM | POA: Diagnosis not present

## 2021-07-15 DIAGNOSIS — I420 Dilated cardiomyopathy: Secondary | ICD-10-CM | POA: Diagnosis not present

## 2021-07-15 DIAGNOSIS — M8580 Other specified disorders of bone density and structure, unspecified site: Secondary | ICD-10-CM | POA: Diagnosis not present

## 2021-07-15 DIAGNOSIS — I5042 Chronic combined systolic (congestive) and diastolic (congestive) heart failure: Secondary | ICD-10-CM | POA: Diagnosis not present

## 2021-07-15 DIAGNOSIS — Z1331 Encounter for screening for depression: Secondary | ICD-10-CM | POA: Diagnosis not present

## 2021-07-15 DIAGNOSIS — Z0000 Encounter for general adult medical examination without abnormal findings: Secondary | ICD-10-CM | POA: Diagnosis not present

## 2021-08-01 DIAGNOSIS — R319 Hematuria, unspecified: Secondary | ICD-10-CM | POA: Diagnosis not present

## 2021-08-13 DIAGNOSIS — R319 Hematuria, unspecified: Secondary | ICD-10-CM | POA: Diagnosis not present

## 2021-09-12 DIAGNOSIS — Z961 Presence of intraocular lens: Secondary | ICD-10-CM | POA: Diagnosis not present

## 2021-09-12 DIAGNOSIS — H35373 Puckering of macula, bilateral: Secondary | ICD-10-CM | POA: Diagnosis not present

## 2021-09-12 DIAGNOSIS — H52203 Unspecified astigmatism, bilateral: Secondary | ICD-10-CM | POA: Diagnosis not present

## 2021-09-12 DIAGNOSIS — H40053 Ocular hypertension, bilateral: Secondary | ICD-10-CM | POA: Diagnosis not present

## 2021-10-01 DIAGNOSIS — I10 Essential (primary) hypertension: Secondary | ICD-10-CM | POA: Diagnosis not present

## 2021-10-01 DIAGNOSIS — I5042 Chronic combined systolic (congestive) and diastolic (congestive) heart failure: Secondary | ICD-10-CM | POA: Diagnosis not present

## 2021-10-01 DIAGNOSIS — M8580 Other specified disorders of bone density and structure, unspecified site: Secondary | ICD-10-CM | POA: Diagnosis not present

## 2021-10-01 DIAGNOSIS — M1990 Unspecified osteoarthritis, unspecified site: Secondary | ICD-10-CM | POA: Diagnosis not present

## 2021-10-29 ENCOUNTER — Other Ambulatory Visit (HOSPITAL_COMMUNITY): Payer: Self-pay | Admitting: Physician Assistant

## 2021-10-29 ENCOUNTER — Other Ambulatory Visit: Payer: Self-pay | Admitting: Physician Assistant

## 2021-10-29 DIAGNOSIS — M25562 Pain in left knee: Secondary | ICD-10-CM | POA: Diagnosis not present

## 2021-11-06 ENCOUNTER — Encounter (HOSPITAL_COMMUNITY)
Admission: RE | Admit: 2021-11-06 | Discharge: 2021-11-06 | Disposition: A | Discharge status: Home / Self Care | Payer: Medicare HMO | Source: Ambulatory Visit | Attending: Physician Assistant | Admitting: Physician Assistant

## 2021-11-06 ENCOUNTER — Other Ambulatory Visit: Payer: Self-pay

## 2021-11-06 DIAGNOSIS — M25562 Pain in left knee: Secondary | ICD-10-CM | POA: Insufficient documentation

## 2021-11-06 DIAGNOSIS — Z96652 Presence of left artificial knee joint: Secondary | ICD-10-CM | POA: Diagnosis not present

## 2021-11-06 DIAGNOSIS — Z471 Aftercare following joint replacement surgery: Secondary | ICD-10-CM | POA: Diagnosis not present

## 2021-11-06 DIAGNOSIS — M1711 Unilateral primary osteoarthritis, right knee: Secondary | ICD-10-CM | POA: Diagnosis not present

## 2021-11-06 MED ORDER — TECHNETIUM TC 99M MEDRONATE IV KIT
20.0000 | PACK | Freq: Once | INTRAVENOUS | Status: AC | PRN
  Administered 2021-11-06: 20 via INTRAVENOUS

## 2021-11-07 DIAGNOSIS — I10 Essential (primary) hypertension: Secondary | ICD-10-CM | POA: Diagnosis not present

## 2021-11-13 DIAGNOSIS — H40053 Ocular hypertension, bilateral: Secondary | ICD-10-CM | POA: Diagnosis not present

## 2021-11-14 DIAGNOSIS — Z96652 Presence of left artificial knee joint: Secondary | ICD-10-CM | POA: Diagnosis not present

## 2021-12-03 DIAGNOSIS — M25561 Pain in right knee: Secondary | ICD-10-CM | POA: Diagnosis not present

## 2021-12-03 DIAGNOSIS — M25562 Pain in left knee: Secondary | ICD-10-CM | POA: Diagnosis not present

## 2021-12-12 DIAGNOSIS — I10 Essential (primary) hypertension: Secondary | ICD-10-CM | POA: Diagnosis not present

## 2021-12-12 DIAGNOSIS — R718 Other abnormality of red blood cells: Secondary | ICD-10-CM | POA: Diagnosis not present

## 2021-12-31 DIAGNOSIS — I10 Essential (primary) hypertension: Secondary | ICD-10-CM | POA: Diagnosis not present

## 2021-12-31 DIAGNOSIS — I420 Dilated cardiomyopathy: Secondary | ICD-10-CM | POA: Diagnosis not present

## 2021-12-31 DIAGNOSIS — I5042 Chronic combined systolic (congestive) and diastolic (congestive) heart failure: Secondary | ICD-10-CM | POA: Diagnosis not present

## 2022-01-08 DIAGNOSIS — I5042 Chronic combined systolic (congestive) and diastolic (congestive) heart failure: Secondary | ICD-10-CM | POA: Diagnosis not present

## 2022-01-08 DIAGNOSIS — I420 Dilated cardiomyopathy: Secondary | ICD-10-CM | POA: Diagnosis not present

## 2022-01-08 DIAGNOSIS — I10 Essential (primary) hypertension: Secondary | ICD-10-CM | POA: Diagnosis not present

## 2022-01-15 DIAGNOSIS — M25511 Pain in right shoulder: Secondary | ICD-10-CM | POA: Diagnosis not present

## 2022-01-22 DIAGNOSIS — I5042 Chronic combined systolic (congestive) and diastolic (congestive) heart failure: Secondary | ICD-10-CM | POA: Diagnosis not present

## 2022-01-22 DIAGNOSIS — I10 Essential (primary) hypertension: Secondary | ICD-10-CM | POA: Diagnosis not present

## 2022-02-26 ENCOUNTER — Other Ambulatory Visit (HOSPITAL_COMMUNITY): Payer: Medicare HMO

## 2022-04-23 DIAGNOSIS — I5042 Chronic combined systolic (congestive) and diastolic (congestive) heart failure: Secondary | ICD-10-CM | POA: Diagnosis not present

## 2022-04-23 DIAGNOSIS — R21 Rash and other nonspecific skin eruption: Secondary | ICD-10-CM | POA: Diagnosis not present

## 2022-04-23 DIAGNOSIS — R197 Diarrhea, unspecified: Secondary | ICD-10-CM | POA: Diagnosis not present

## 2022-04-23 DIAGNOSIS — I10 Essential (primary) hypertension: Secondary | ICD-10-CM | POA: Diagnosis not present

## 2022-04-29 DIAGNOSIS — S70261A Insect bite (nonvenomous), right hip, initial encounter: Secondary | ICD-10-CM | POA: Diagnosis not present

## 2022-04-29 DIAGNOSIS — L539 Erythematous condition, unspecified: Secondary | ICD-10-CM | POA: Diagnosis not present

## 2022-05-19 ENCOUNTER — Other Ambulatory Visit: Payer: Self-pay | Admitting: Internal Medicine

## 2022-05-19 DIAGNOSIS — R1030 Lower abdominal pain, unspecified: Secondary | ICD-10-CM

## 2022-05-20 DIAGNOSIS — M545 Low back pain, unspecified: Secondary | ICD-10-CM | POA: Diagnosis not present

## 2022-05-20 DIAGNOSIS — Z01419 Encounter for gynecological examination (general) (routine) without abnormal findings: Secondary | ICD-10-CM | POA: Diagnosis not present

## 2022-05-20 DIAGNOSIS — Z6825 Body mass index (BMI) 25.0-25.9, adult: Secondary | ICD-10-CM | POA: Diagnosis not present

## 2022-05-20 DIAGNOSIS — R102 Pelvic and perineal pain: Secondary | ICD-10-CM | POA: Diagnosis not present

## 2022-05-22 DIAGNOSIS — K580 Irritable bowel syndrome with diarrhea: Secondary | ICD-10-CM | POA: Diagnosis not present

## 2022-05-25 ENCOUNTER — Other Ambulatory Visit: Payer: Medicare HMO

## 2022-05-25 DIAGNOSIS — H04123 Dry eye syndrome of bilateral lacrimal glands: Secondary | ICD-10-CM | POA: Diagnosis not present

## 2022-05-28 DIAGNOSIS — M8580 Other specified disorders of bone density and structure, unspecified site: Secondary | ICD-10-CM | POA: Diagnosis not present

## 2022-05-28 DIAGNOSIS — M1611 Unilateral primary osteoarthritis, right hip: Secondary | ICD-10-CM | POA: Diagnosis not present

## 2022-05-28 DIAGNOSIS — I10 Essential (primary) hypertension: Secondary | ICD-10-CM | POA: Diagnosis not present

## 2022-05-28 DIAGNOSIS — I5042 Chronic combined systolic (congestive) and diastolic (congestive) heart failure: Secondary | ICD-10-CM | POA: Diagnosis not present

## 2022-05-29 ENCOUNTER — Ambulatory Visit
Admission: RE | Admit: 2022-05-29 | Discharge: 2022-05-29 | Disposition: A | Discharge status: Home / Self Care | Payer: Medicare HMO | Source: Ambulatory Visit | Attending: Gastroenterology | Admitting: Gastroenterology

## 2022-05-29 ENCOUNTER — Other Ambulatory Visit: Payer: Self-pay | Admitting: Gastroenterology

## 2022-05-29 DIAGNOSIS — K529 Noninfective gastroenteritis and colitis, unspecified: Secondary | ICD-10-CM

## 2022-05-29 DIAGNOSIS — M47816 Spondylosis without myelopathy or radiculopathy, lumbar region: Secondary | ICD-10-CM | POA: Diagnosis not present

## 2022-06-10 DIAGNOSIS — R102 Pelvic and perineal pain: Secondary | ICD-10-CM | POA: Diagnosis not present

## 2022-06-22 DIAGNOSIS — R197 Diarrhea, unspecified: Secondary | ICD-10-CM | POA: Diagnosis not present

## 2022-06-22 DIAGNOSIS — K5289 Other specified noninfective gastroenteritis and colitis: Secondary | ICD-10-CM | POA: Diagnosis not present

## 2022-06-22 DIAGNOSIS — K6289 Other specified diseases of anus and rectum: Secondary | ICD-10-CM | POA: Diagnosis not present

## 2022-06-22 DIAGNOSIS — K633 Ulcer of intestine: Secondary | ICD-10-CM | POA: Diagnosis not present

## 2022-06-22 DIAGNOSIS — D125 Benign neoplasm of sigmoid colon: Secondary | ICD-10-CM | POA: Diagnosis not present

## 2022-06-22 DIAGNOSIS — K648 Other hemorrhoids: Secondary | ICD-10-CM | POA: Diagnosis not present

## 2022-06-22 DIAGNOSIS — K6389 Other specified diseases of intestine: Secondary | ICD-10-CM | POA: Diagnosis not present

## 2022-06-22 DIAGNOSIS — K529 Noninfective gastroenteritis and colitis, unspecified: Secondary | ICD-10-CM | POA: Diagnosis not present

## 2022-06-24 DIAGNOSIS — K5289 Other specified noninfective gastroenteritis and colitis: Secondary | ICD-10-CM | POA: Diagnosis not present

## 2022-06-24 DIAGNOSIS — D125 Benign neoplasm of sigmoid colon: Secondary | ICD-10-CM | POA: Diagnosis not present

## 2022-07-03 DIAGNOSIS — M26601 Right temporomandibular joint disorder, unspecified: Secondary | ICD-10-CM | POA: Diagnosis not present

## 2022-07-03 DIAGNOSIS — K529 Noninfective gastroenteritis and colitis, unspecified: Secondary | ICD-10-CM | POA: Diagnosis not present

## 2022-07-07 ENCOUNTER — Telehealth: Payer: Self-pay

## 2022-07-07 NOTE — Telephone Encounter (Signed)
Message sent to chart prep, for referral from requesting office and new pt appt.

## 2022-07-07 NOTE — Telephone Encounter (Signed)
Primary Cardiologist:None  Chart reviewed as part of pre-operative protocol coverage. Because of Katie Vasquez past medical history and time since last visit, he/she will require a follow-up visit in order to better assess preoperative cardiovascular risk.  Pre-op covering staff: - Please schedule appointment and call patient to inform them. Patient must be seen by MD due to no visit since 02/2019 and is therefor a new patient.  - Please contact requesting surgeon's office via preferred method (i.e, phone, fax) to inform them of need for appointment prior to surgery.  If applicable, this message will also be routed to pharmacy pool and/or primary cardiologist for input on holding anticoagulant/antiplatelet agent as requested below so that this information is available at time of patient's appointment.   Emmaline Life, NP-C  07/07/2022, 1:30 PM [address] Office [phone] Fax [phone]
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
## 2022-07-07 NOTE — Telephone Encounter (Signed)
   Pre-operative Risk Assessment    Patient Name: Katie Vasquez  DOB: 02/13/1941 MRN: [id number]      Request for Surgical Clearance    Procedure:   Left Knee poly vs total knee revision   Date of Surgery:  Clearance 07/22/22                                 Surgeon:  Dr. Gaynelle Arabian Surgeon's Group or Practice Name:  [facility] Phone number:  [phone] Fax number:  [phone]   Type of Clearance Requested:   - Medical    Type of Anesthesia:   Choice   Additional requests/questions:    SignedJacqulynn Cadet   07/07/2022, 1:08 PM

## 2022-07-08 NOTE — Telephone Encounter (Signed)
Pt has new pt appt 07/10/22 with Dr. Sallyanne Kuster for pre op clearance.

## 2022-07-10 ENCOUNTER — Encounter: Payer: Self-pay | Admitting: Cardiovascular Disease

## 2022-07-10 ENCOUNTER — Ambulatory Visit: Payer: Medicare HMO | Attending: Cardiovascular Disease | Admitting: Cardiovascular Disease

## 2022-07-10 VITALS — BP 139/79 | HR 89 | Ht 64.5 in | Wt 138.2 lb

## 2022-07-10 DIAGNOSIS — I447 Left bundle-branch block, unspecified: Secondary | ICD-10-CM

## 2022-07-10 DIAGNOSIS — K529 Noninfective gastroenteritis and colitis, unspecified: Secondary | ICD-10-CM | POA: Diagnosis not present

## 2022-07-10 DIAGNOSIS — Z01810 Encounter for preprocedural cardiovascular examination: Secondary | ICD-10-CM | POA: Diagnosis not present

## 2022-07-10 DIAGNOSIS — M26601 Right temporomandibular joint disorder, unspecified: Secondary | ICD-10-CM | POA: Diagnosis not present

## 2022-07-10 DIAGNOSIS — I10 Essential (primary) hypertension: Secondary | ICD-10-CM

## 2022-07-10 DIAGNOSIS — I5042 Chronic combined systolic (congestive) and diastolic (congestive) heart failure: Secondary | ICD-10-CM | POA: Diagnosis not present
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
## 2022-07-10 NOTE — Patient Instructions (Signed)
Medication Instructions:  No changes *If you need a refill on your cardiac medications before your next appointment, please call your pharmacy*   Lab Work: None ordered If you have labs (blood work) drawn today and your tests are completely normal, you will receive your results only by: MyChart Message (if you have MyChart) OR A paper copy in the mail If you have any lab test that is abnormal or we need to change your treatment, we will call you to review the results.   Testing/Procedures: None ordered   Follow-Up: At [facility], you and your health needs are our priority.  As part of our continuing mission to provide you with exceptional heart care, we have created designated Provider Care Teams.  These Care Teams include your primary Cardiologist (physician) and Advanced Practice Providers (APPs -  Physician Assistants and Nurse Practitioners) who all work together to provide you with the care you need, when you need it.  We recommend signing up for the patient portal called "MyChart".  Sign up information is provided on this After Visit Summary.  MyChart is used to connect with patients for Virtual Visits (Telemedicine).  Patients are able to view lab/test results, encounter notes, upcoming appointments, etc.  Non-urgent messages can be sent to your provider as well.   To learn more about what you can do with MyChart, go to [email or web address].    Your next appointment:   Follow up as needed  Important Information About Sugar       

## 2022-07-13 DIAGNOSIS — M26601 Right temporomandibular joint disorder, unspecified: Secondary | ICD-10-CM | POA: Diagnosis not present

## 2022-07-16 DIAGNOSIS — M179 Osteoarthritis of knee, unspecified: Secondary | ICD-10-CM | POA: Diagnosis not present

## 2022-07-16 DIAGNOSIS — K529 Noninfective gastroenteritis and colitis, unspecified: Secondary | ICD-10-CM | POA: Diagnosis not present

## 2022-07-16 DIAGNOSIS — M8580 Other specified disorders of bone density and structure, unspecified site: Secondary | ICD-10-CM | POA: Diagnosis not present

## 2022-07-16 DIAGNOSIS — I447 Left bundle-branch block, unspecified: Secondary | ICD-10-CM | POA: Diagnosis not present

## 2022-07-16 DIAGNOSIS — I700 Atherosclerosis of aorta: Secondary | ICD-10-CM | POA: Diagnosis not present

## 2022-07-16 DIAGNOSIS — D473 Essential (hemorrhagic) thrombocythemia: Secondary | ICD-10-CM | POA: Diagnosis not present

## 2022-07-16 DIAGNOSIS — I5042 Chronic combined systolic (congestive) and diastolic (congestive) heart failure: Secondary | ICD-10-CM | POA: Diagnosis not present

## 2022-07-16 DIAGNOSIS — M8589 Other specified disorders of bone density and structure, multiple sites: Secondary | ICD-10-CM | POA: Diagnosis not present

## 2022-07-16 DIAGNOSIS — Z136 Encounter for screening for cardiovascular disorders: Secondary | ICD-10-CM | POA: Diagnosis not present

## 2022-07-16 DIAGNOSIS — I420 Dilated cardiomyopathy: Secondary | ICD-10-CM | POA: Diagnosis not present

## 2022-07-16 DIAGNOSIS — Z1331 Encounter for screening for depression: Secondary | ICD-10-CM | POA: Diagnosis not present

## 2022-07-16 DIAGNOSIS — Z0000 Encounter for general adult medical examination without abnormal findings: Secondary | ICD-10-CM | POA: Diagnosis not present

## 2022-07-16 DIAGNOSIS — I10 Essential (primary) hypertension: Secondary | ICD-10-CM | POA: Diagnosis not present

## 2022-07-16 DIAGNOSIS — N3941 Urge incontinence: Secondary | ICD-10-CM | POA: Diagnosis not present

## 2022-07-16 NOTE — Progress Notes (Addendum)
COVID Vaccine Completed: Yes  Date of COVID positive in last 90 days:  No  PCP - Rochele Raring, MD Cardiologist - Sanda Klein, MD  Cardiac clearance in Epic dated 07-10-22 by Dr. Sallyanne Kuster  Chest x-ray -  EKG - 07-10-22 Epic Stress Test - 2013 Epic ECHO - 2018 Epic Cardiac Cath - N/A Pacemaker/ICD device last checked: Spinal Cord Stimulator: N/A  Bowel Prep - N/A  Sleep Study - N/A CPAP -   Fasting Blood Sugar - N/A Checks Blood Sugar _____ times a day  Blood Thinner Instructions:  N/A Aspirin Instructions: Last Dose:  Activity level:  Can go up a flight of stairs and perform activities of daily living without stopping and without symptoms of chest pain.  Patient has  or shortness of breath with exertion.  She said any activity will make her winded.  Patient does exercise, walks daily as tolerated   Anesthesia review: LBBB, CHF, HTN. Shortness of breath. BP elevated at PAT 170/78 and on recheck 168/88.  Patient asymptomatic.  Patient denies shortness of breath, fever, cough and chest pain at PAT appointment  Patient verbalized understanding of instructions that were given to them at the PAT appointment. Patient was also instructed that they will need to review over the PAT instructions again at home before surgery.

## 2022-07-16 NOTE — Patient Instructions (Signed)
SURGICAL WAITING ROOM VISITATION Patients having surgery or a procedure may have no more than 2 support people in the waiting area - these visitors may rotate.   Children under the age of 73 must have an adult with them who is not the patient. If the patient needs to stay at the hospital during part of their recovery, the visitor guidelines for inpatient rooms apply. Pre-op nurse will coordinate an appropriate time for 1 support person to accompany patient in pre-op.  This support person may not rotate.    Please refer to the [facility] website for the visitor guidelines for Inpatients (after your surgery is over and you are in a regular room).      Your procedure is scheduled on: 07-22-22   Report to [facility] Main Entrance    Report to admitting at 6:00 AM   Call this number if you have problems the morning of surgery [phone]   Do not eat food :After Midnight.   After Midnight you may have the following liquids until 5:30 AM/ DAY OF SURGERY  Water Non-Citrus Juices (without pulp, NO RED) Carbonated Beverages Black Coffee (NO MILK/CREAM OR CREAMERS, sugar ok)  Clear Tea (NO MILK/CREAM OR CREAMERS, sugar ok) regular and decaf                             Plain Jell-O (NO RED)                                           Fruit ices (not with fruit pulp, NO RED)                                     Popsicles (NO RED)                                                               Sports drinks like Gatorade (NO RED)                   The day of surgery:  Drink ONE (1) Pre-Surgery Clear Ensure at 5:30 AM the morning of surgery. Drink in one sitting. Do not sip.  This drink was given to you during your hospital  pre-op appointment visit. Nothing else to drink after completing the Pre-Surgery Clear Ensure .          If you have questions, please contact your surgeon's office.   FOLLOW ANY ADDITIONAL PRE OP INSTRUCTIONS YOU RECEIVED FROM YOUR SURGEON'S OFFICE!!!      Oral Hygiene is also important to reduce your risk of infection.                                    Remember - BRUSH YOUR TEETH THE MORNING OF SURGERY WITH YOUR REGULAR TOOTHPASTE   Do NOT smoke after Midnight   Take these medicines the morning of surgery with A SIP OF WATER:   Amlodipine  You may not have any metal on your body including hair pins, jewelry, and body piercing             Do not wear make-up, lotions, powders, perfumes or deodorant  Do not wear nail polish including gel and S&S, artificial/acrylic nails, or any other type of covering on natural nails including finger and toenails. If you have artificial nails, gel coating, etc. that needs to be removed by a nail salon please have this removed prior to surgery or surgery may need to be canceled/ delayed if the surgeon/ anesthesia feels like they are unable to be safely monitored.   Do not shave  48 hours prior to surgery.             Do not bring valuables to the hospital. [place].   Contacts, dentures or bridgework may not be worn into surgery.   Bring small overnight bag day of surgery.   DO NOT [place]. PHARMACY WILL DISPENSE MEDICATIONS LISTED ON YOUR MEDICATION LIST TO YOU DURING YOUR ADMISSION [place]!     Special Instructions: Bring a copy of your healthcare power of attorney and living will documents the day of surgery if you haven't scanned them before.              Please read over the following fact sheets you were given: IF [place]  If you received a COVID test during your pre-op visit  it is requested that you wear a mask when out in public, stay away from anyone that may not be feeling well and notify your surgeon if you develop symptoms. If you test positive for Covid or have been in contact with anyone that has tested  positive in the last 10 days please notify you surgeon.  [place] - Preparing for Surgery Before surgery, you can play an important role.  Because skin is not sterile, your skin needs to be as free of germs as possible.  You can reduce the number of germs on your skin by washing with CHG (chlorahexidine gluconate) soap before surgery.  CHG is an antiseptic cleaner which kills germs and bonds with the skin to continue killing germs even after washing. Please DO NOT use if you have an allergy to CHG or antibacterial soaps.  If your skin becomes reddened/irritated stop using the CHG and inform your nurse when you arrive at Short Stay. Do not shave (including legs and underarms) for at least 48 hours prior to the first CHG shower.  You may shave your face/neck.  Please follow these instructions carefully:  1.  Shower with CHG Soap the night before surgery and the  morning of surgery.  2.  If you choose to wash your hair, wash your hair first as usual with your normal  shampoo.  3.  After you shampoo, rinse your hair and body thoroughly to remove the shampoo.                             4.  Use CHG as you would any other liquid soap.  You can apply chg directly to the skin and wash.  Gently with a scrungie or clean washcloth.  5.  Apply the CHG Soap to your body ONLY FROM THE NECK DOWN.   Do   not use on  face/ open                           Wound or open sores. Avoid contact with eyes, ears mouth and   genitals (private parts).                       Wash face,  Genitals (private parts) with your normal soap.             6.  Wash thoroughly, paying special attention to the area where your    surgery  will be performed.  7.  Thoroughly rinse your body with warm water from the neck down.  8.  DO NOT shower/wash with your normal soap after using and rinsing off the CHG Soap.                9.  Pat yourself dry with a clean towel.            10.  Wear clean pajamas.            11.  Place clean sheets on  your bed the night of your first shower and do not  sleep with pets. Day of Surgery : Do not apply any lotions/deodorants the morning of surgery.  Please wear clean clothes to the hospital/surgery center.  FAILURE TO FOLLOW THESE INSTRUCTIONS MAY RESULT IN THE CANCELLATION OF YOUR SURGERY  PATIENT SIGNATURE_________________________________  NURSE SIGNATURE__________________________________  ________________________________________________________________________     Katie Vasquez  An incentive spirometer is a tool that can help keep your lungs clear and active. This tool measures how well you are filling your lungs with each breath. Taking long deep breaths may help reverse or decrease the chance of developing breathing (pulmonary) problems (especially infection) following: A long period of time when you are unable to move or be active. BEFORE THE PROCEDURE  If the spirometer includes an indicator to show your best effort, your nurse or respiratory therapist will set it to a desired goal. If possible, sit up straight or lean slightly forward. Try not to slouch. Hold the incentive spirometer in an upright position. INSTRUCTIONS FOR USE  Sit on the edge of your bed if possible, or sit up as far as you can in bed or on a chair. Hold the incentive spirometer in an upright position. Breathe out normally. Place the mouthpiece in your mouth and seal your lips tightly around it. Breathe in slowly and as deeply as possible, raising the piston or the ball toward the top of the column. Hold your breath for 3-5 seconds or for as long as possible. Allow the piston or ball to fall to the bottom of the column. Remove the mouthpiece from your mouth and breathe out normally. Rest for a few seconds and repeat Steps 1 through 7 at least 10 times every 1-2 hours when you are awake. Take your time and take a few normal breaths between deep breaths. The spirometer may include an indicator to show your  best effort. Use the indicator as a goal to work toward during each repetition. After each set of 10 deep breaths, practice coughing to be sure your lungs are clear. If you have an incision (the cut made at the time of surgery), support your incision when coughing by placing a pillow or rolled up towels firmly against it. Once you are able to get out of bed, walk around indoors and cough well. You may stop  using the incentive spirometer when instructed by your caregiver.  RISKS AND COMPLICATIONS Take your time so you do not get dizzy or light-headed. If you are in pain, you may need to take or ask for pain medication before doing incentive spirometry. It is harder to take a deep breath if you are having pain. AFTER USE Rest and breathe slowly and easily. It can be helpful to keep track of a log of your progress. Your caregiver can provide you with a simple table to help with this. If you are using the spirometer at home, follow these instructions: [place] IF:  You are having difficultly using the spirometer. You have trouble using the spirometer as often as instructed. Your pain medication is not giving enough relief while using the spirometer. You develop fever of 100.5 F (38.1 C) or higher. SEEK IMMEDIATE MEDICAL CARE IF:  You cough up bloody sputum that had not been present before. You develop fever of 102 F (38.9 C) or greater. You develop worsening pain at or near the incision site. MAKE SURE YOU:  Understand these instructions. Will watch your condition. Will get help right away if you are not doing well or get worse. Document Released: 02/01/2007 Document Revised: 12/14/2011 Document Reviewed: 04/04/2007 Exit[employer] Patient Information 2014 [employer], [place].   ________________________________________________________________________  WHAT IS A BLOOD TRANSFUSION? Blood Transfusion Information  A transfusion is the replacement of blood or some of its parts. Blood is made  up of multiple cells which provide different functions. Red blood cells carry oxygen and are used for blood loss replacement. White blood cells fight against infection. Platelets control bleeding. Plasma helps clot blood. Other blood products are available for specialized needs, such as hemophilia or other clotting disorders. BEFORE THE TRANSFUSION  Who gives blood for transfusions?  Healthy volunteers who are fully evaluated to make sure their blood is safe. This is blood bank blood. Transfusion therapy is the safest it has ever been in the practice of medicine. Before blood is taken from a donor, a complete history is taken to make sure that person has no history of diseases nor engages in risky social behavior (examples are intravenous drug use or sexual activity with multiple partners). The donor's travel history is screened to minimize risk of transmitting infections, such as malaria. The donated blood is tested for signs of infectious diseases, such as HIV and hepatitis. The blood is then tested to be sure it is compatible with you in order to minimize the chance of a transfusion reaction. If you or a relative donates blood, this is often done in anticipation of surgery and is not appropriate for emergency situations. It takes many days to process the donated blood. RISKS AND COMPLICATIONS Although transfusion therapy is very safe and saves many lives, the main dangers of transfusion include:  Getting an infectious disease. Developing a transfusion reaction. This is an allergic reaction to something in the blood you were given. Every precaution is taken to prevent this. The decision to have a blood transfusion has been considered carefully by your caregiver before blood is given. Blood is not given unless the benefits outweigh the risks. AFTER THE TRANSFUSION Right after receiving a blood transfusion, you will usually feel much better and more energetic. This is especially true if your red  blood cells have gotten low (anemic). The transfusion raises the level of the red blood cells which carry oxygen, and this usually causes an energy increase. The nurse administering the transfusion will monitor you  carefully for complications. HOME CARE INSTRUCTIONS  No special instructions are needed after a transfusion. You may find your energy is better. Speak with your caregiver about any limitations on activity for underlying diseases you may have. SEEK MEDICAL CARE IF:  Your condition is not improving after your transfusion. You develop redness or irritation at the intravenous (IV) site. SEEK IMMEDIATE MEDICAL CARE IF:  Any of the following symptoms occur over the next 12 hours: Shaking chills. You have a temperature by mouth above 102 F (38.9 C), not controlled by medicine. Chest, back, or muscle pain. People around you feel you are not acting correctly or are confused. Shortness of breath or difficulty breathing. Dizziness and fainting. You get a rash or develop hives. You have a decrease in urine output. Your urine turns a dark color or changes to pink, red, or brown. Any of the following symptoms occur over the next 10 days: You have a temperature by mouth above 102 F (38.9 C), not controlled by medicine. Shortness of breath. Weakness after normal activity. The white part of the eye turns yellow (jaundice). You have a decrease in the amount of urine or are urinating less often. Your urine turns a dark color or changes to pink, red, or brown. Document Released: 09/18/2000 Document Revised: 12/14/2011 Document Reviewed: 05/07/2008 [facility] Patient Information 2014 [place], [place].  _______________________________________________________________________

## 2022-07-17 ENCOUNTER — Encounter (HOSPITAL_COMMUNITY): Payer: Self-pay

## 2022-07-17 ENCOUNTER — Encounter (HOSPITAL_COMMUNITY)
Admission: RE | Admit: 2022-07-17 | Discharge: 2022-07-17 | Disposition: A | Discharge status: Home / Self Care | Payer: Medicare HMO | Source: Ambulatory Visit | Attending: Orthopedic Surgery | Admitting: Orthopedic Surgery

## 2022-07-17 ENCOUNTER — Other Ambulatory Visit: Payer: Self-pay

## 2022-07-17 VITALS — BP 168/88 | HR 73 | Temp 97.9°F | Resp 16 | Ht 64.0 in | Wt 133.4 lb

## 2022-07-17 DIAGNOSIS — I2510 Atherosclerotic heart disease of native coronary artery without angina pectoris: Secondary | ICD-10-CM | POA: Insufficient documentation

## 2022-07-17 DIAGNOSIS — Z01812 Encounter for preprocedural laboratory examination: Secondary | ICD-10-CM | POA: Diagnosis not present

## 2022-07-17 DIAGNOSIS — I509 Heart failure, unspecified: Secondary | ICD-10-CM | POA: Diagnosis not present

## 2022-07-17 DIAGNOSIS — I110 Hypertensive heart disease with heart failure: Secondary | ICD-10-CM | POA: Insufficient documentation

## 2022-07-17 DIAGNOSIS — I447 Left bundle-branch block, unspecified: Secondary | ICD-10-CM | POA: Insufficient documentation

## 2022-07-17 DIAGNOSIS — K769 Liver disease, unspecified: Secondary | ICD-10-CM | POA: Diagnosis not present

## 2022-07-17 DIAGNOSIS — Z96652 Presence of left artificial knee joint: Secondary | ICD-10-CM | POA: Insufficient documentation

## 2022-07-17 DIAGNOSIS — Z01818 Encounter for other preprocedural examination: Secondary | ICD-10-CM

## 2022-07-17 HISTORY — DX: Dislocation of jaw, unspecified side, initial encounter: S03.00XA

## 2022-07-17 HISTORY — DX: Anemia, unspecified: D64.9

## 2022-07-17 HISTORY — DX: Depression, unspecified: F32.A

## 2022-07-17 LAB — CBC
HCT: 41.9 % (ref 36.0–46.0)
Hemoglobin: 13.8 g/dL (ref 12.0–15.0)
MCH: 28.4 pg (ref 26.0–34.0)
MCHC: 32.9 g/dL (ref 30.0–36.0)
MCV: 86.2 fL (ref 80.0–100.0)
Platelets: 474 10*3/uL — ABNORMAL HIGH (ref 150–400)
RBC: 4.86 MIL/uL (ref 3.87–5.11)
RDW: 13.4 % (ref 11.5–15.5)
WBC: 7.8 10*3/uL (ref 4.0–10.5)
nRBC: 0 % (ref 0.0–0.2)

## 2022-07-17 LAB — COMPREHENSIVE METABOLIC PANEL
ALT: 30 U/L (ref 0–44)
AST: 24 U/L (ref 15–41)
Albumin: 3.8 g/dL (ref 3.5–5.0)
Alkaline Phosphatase: 91 U/L (ref 38–126)
Anion gap: 7 (ref 5–15)
BUN: 18 mg/dL (ref 8–23)
CO2: 27 mmol/L (ref 22–32)
Calcium: 9.5 mg/dL (ref 8.9–10.3)
Chloride: 103 mmol/L (ref 98–111)
Creatinine, Ser: 0.84 mg/dL (ref 0.44–1.00)
GFR, Estimated: >60 mL/min (ref >60)
Glucose, Bld: 89 mg/dL (ref 70–99)
Potassium: 3.8 mmol/L (ref 3.5–5.1)
Sodium: 137 mmol/L (ref 135–145)
Total Bilirubin: 0.6 mg/dL (ref 0.3–1.2)
Total Protein: 6.7 g/dL (ref 6.5–8.1)

## 2022-07-17 LAB — SURGICAL PCR SCREEN
MRSA, PCR: NEGATIVE
Staphylococcus aureus: NEGATIVE

## 2022-07-20 ENCOUNTER — Encounter (HOSPITAL_COMMUNITY): Payer: Self-pay | Admitting: Physician Assistant

## 2022-07-20 ENCOUNTER — Telehealth: Payer: Self-pay | Admitting: Cardiovascular Disease

## 2022-07-20 DIAGNOSIS — M26601 Right temporomandibular joint disorder, unspecified: Secondary | ICD-10-CM | POA: Diagnosis not present

## 2022-07-20 NOTE — Telephone Encounter (Signed)
Called patient left message on personal voice mail she will have to come to office to sign a release before we can give you your last office note.I will send message to Dr.Croitoru's RN to let her know you would like his business card.

## 2022-07-20 NOTE — Telephone Encounter (Signed)
Patient stated she would like to get a copy of the doctor's office visit notes and include Dr. Victorino December business card.
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
## 2022-07-20 NOTE — Anesthesia Preprocedure Evaluation (Signed)
Anesthesia Evaluation    Airway        Dental   Pulmonary           Cardiovascular hypertension,      Neuro/Psych    GI/Hepatic   Endo/Other    Renal/GU      Musculoskeletal   Abdominal   Peds  Hematology   Anesthesia Other Findings   Reproductive/Obstetrics                             Anesthesia Physical Anesthesia Plan  ASA:   Anesthesia Plan:    Post-op Pain Management:    Induction:   PONV Risk Score and Plan:   Airway Management Planned:   Additional Equipment:   Intra-op Plan:   Post-operative Plan:   Informed Consent:   Plan Discussed with:   Anesthesia Plan Comments: (See PAT note 07/17/2022)        Anesthesia Quick Evaluation

## 2022-07-22 ENCOUNTER — Inpatient Hospital Stay (HOSPITAL_COMMUNITY): Admission: RE | Admit: 2022-07-22 | Payer: Medicare HMO | Source: Home / Self Care | Admitting: Orthopedic Surgery

## 2022-07-22 ENCOUNTER — Encounter (HOSPITAL_COMMUNITY): Admission: RE | Payer: Self-pay | Source: Home / Self Care

## 2022-07-22 LAB — TYPE AND SCREEN
ABO/RH(D): B POS
Antibody Screen: NEGATIVE

## 2022-07-22 SURGERY — TOTAL KNEE REVISION
Anesthesia: Choice | Site: Knee | Laterality: Left

## 2022-07-27 DIAGNOSIS — M25511 Pain in right shoulder: Secondary | ICD-10-CM | POA: Diagnosis not present
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
## 2022-08-20 NOTE — Progress Notes (Signed)
Sent message, via epic in basket, requesting orders in epic from surgeon.  

## 2022-08-28 NOTE — Progress Notes (Addendum)
COVID Vaccine received:  _0  No _1  Yes Date of any COVID positive Test in last 90 days:   none  PCP - Creola Corn, MD   Lavone Orn, MD  (Retired) Medical Clearance on chart  Cardiologist - Sanda Klein, MD   Clearance on Chart- 07-10-22  Chest x-ray - n/a EKG -  07-10-2022 Epic Stress Test - 2013  Epic ECHO - 2018 Epic Cardiac Cath - none  PCR screen: _2  Ordered & Completed                      _3   No Order but Needs PROFEND                      _4   N/A for this surgery  Surgery Plan:  _5  Ambulatory                            _6  Outpatient in bed                            _7  Admit  Anesthesia:    _8  General  _9  Spinal                           _10   Choice _11   MAC  Pacemaker / ICD device _12  No _13  Yes        Device order form faxed _14  No    _15   Yes      Faxed to:  Spinal Cord Stimulator:_16  No _17  Yes      (Remind patient to bring remote DOS) Other Implants:   History of Sleep Apnea? _18  No _19  Yes   CPAP used?- _20  No _21  Yes    Does the patient monitor blood sugar? _22  No _23  Yes  _24  N/A  Blood Thinner / Instructions: none Aspirin Instructions:none  ERAS Protocol Ordered: _25  No  _26  Yes PRE-SURGERY _27  ENSURE  _28  G2  _29  No Drink Ordered  Patient is to be NPO after: 06:15 am  Comments: Case was originally scheduled for 07-22-22.  Activity level:  Can go up a flight of stairs and perform activities of daily living without stopping and without symptoms of chest pain.  Patient has  or shortness of breath with exertion.  She said any activity will make her winded.  Patient does exercise, walks daily as tolerated   Anesthesia review: LBBB, HTN, CHF, SOB  Patient denies shortness of breath, fever, cough and chest pain at PAT appointment.  Patient verbalized understanding and agreement to the Pre-Surgical Instructions that were given to them at this PAT appointment. Patient was also educated of the need to review these PAT instructions again prior to his/her  surgery.I reviewed the appropriate phone numbers to call if they have any and questions or concerns.

## 2022-08-28 NOTE — Patient Instructions (Signed)
SURGICAL WAITING ROOM VISITATION Patients having surgery or a procedure may have no more than 2 support people in the waiting area - these visitors may rotate in the visitor waiting room.   Children under the age of 43 must have an adult with them who is not the patient. If the patient needs to stay at the hospital during part of their recovery, the visitor guidelines for inpatient rooms apply.  PRE-OP VISITATION  Pre-op nurse will coordinate an appropriate time for 1 support person to accompany the patient in pre-op.  This support person may not rotate.  This visitor will be contacted when the time is appropriate for the visitor to come back in the pre-op area.  Please refer to the [facility] website for the visitor guidelines for Inpatients (after your surgery is over and you are in a regular room).  You are not required to quarantine at this time prior to your surgery. However, you must do this: Hand Hygiene often Do NOT share personal items Notify your provider if you are in close contact with someone who has COVID or you develop fever 100.4 or greater, new onset of sneezing, cough, sore throat, shortness of breath or body aches.  If you test positive for Covid or have been in contact with anyone that has tested positive in the last 10 days please notify you surgeon.    Your procedure is scheduled on:  Wednesday  September 02, 2022  Report to [facility] Main Entrance: [address] entrance where the [employer] is available.   Report to admitting at: 06:45 AM  +++++Call this number if you have any questions or problems the morning of surgery [phone]  Do not eat food after Midnight the night prior to your surgery/procedure.  After Midnight you may have the following liquids until   06:15  AM DAY OF SURGERY  Clear Liquid Diet Water Black Coffee (sugar ok, NO MILK/CREAM OR CREAMERS)  Tea (sugar ok, NO MILK/CREAM OR CREAMERS) regular and decaf                              Plain Jell-O  with no fruit (NO RED)                                           Fruit ices (not with fruit pulp, NO RED)                                     Popsicles (NO RED)                                                                  Juice: apple, WHITE grape, WHITE cranberry Sports drinks like Gatorade or Powerade (NO RED)                    The day of surgery:  Drink ONE (1) Pre-Surgery Clear Ensure at 06:15  AM the morning of surgery. Drink in one sitting. Do not sip.  This drink was given to  you during your hospital pre-op appointment visit. Nothing else to drink after completing the Pre-Surgery Clear Ensure : No candy, chewing gum or throat lozenges.    FOLLOW  ANY ADDITIONAL PRE OP INSTRUCTIONS YOU RECEIVED FROM YOUR SURGEON'S OFFICE!!!   Oral Hygiene is also important to reduce your risk of infection.        Remember - BRUSH YOUR TEETH THE MORNING OF SURGERY WITH YOUR REGULAR TOOTHPASTE  Take ONLY these medicines the morning of surgery with A SIP OF WATER:  ? Hs Amlodipine                    You may not have any metal on your body including hair pins, jewelry, and body piercing  Do not wear make-up, lotions, powders, perfumes or deodorant  Do not wear nail polish including gel and S&S, artificial / acrylic nails, or any other type of covering on natural nails including finger and toenails. If you have artificial nails, gel coating, etc., that needs to be removed by a nail salon, Please have this removed prior to surgery. Not doing so may mean that your surgery could be cancelled or delayed if the Surgeon or anesthesia staff feels like they are unable to monitor you safely.   Do not shave 48 hours prior to surgery to avoid nicks in your skin which may contribute to postoperative infections.   Contacts, Hearing Aids, dentures or bridgework may not be worn into surgery.   You may bring a small overnight bag with you on the day of surgery, only pack items that are not  valuable .[facility] IS NOT RESPONSIBLE   FOR VALUABLES THAT ARE LOST OR STOLEN.   Do not bring your home medications to the hospital. The Pharmacy will dispense medications listed on your medication list to you during your admission in the Hospital.  Special Instructions: Bring a copy of your healthcare power of attorney and living will documents the day of surgery, if you wish to have them scanned into your [facility] Records- EPIC  Please read over the following fact sheets you were given: IF YOU HAVE QUESTIONS ABOUT YOUR PRE-OP INSTRUCTIONS, PLEASE CALL [phone]  ([place])   [facility] - Preparing for Surgery Before surgery, you can play an important role.  Because skin is not sterile, your skin needs to be as free of germs as possible.  You can reduce the number of germs on your skin by washing with CHG (chlorahexidine gluconate) soap before surgery.  CHG is an antiseptic cleaner which kills germs and bonds with the skin to continue killing germs even after washing. Please DO NOT use if you have an allergy to CHG or antibacterial soaps.  If your skin becomes reddened/irritated stop using the CHG and inform your nurse when you arrive at Short Stay. Do not shave (including legs and underarms) for at least 48 hours prior to the first CHG shower.  You may shave your face/neck.  Please follow these instructions carefully:  1.  Shower with CHG Soap the night before surgery and the  morning of surgery.  2.  If you choose to wash your hair, wash your hair first as usual with your normal  shampoo.  3.  After you shampoo, rinse your hair and body thoroughly to remove the shampoo.                             4.  Use CHG  as you would any other liquid soap.  You can apply chg directly to the skin and wash.  Gently with a scrungie or clean washcloth.  5.  Apply the CHG Soap to your body ONLY FROM THE NECK DOWN.   Do not use on face/ open                           Wound or open sores. Avoid  contact with eyes, ears mouth and genitals (private parts).                       Wash face,  Genitals (private parts) with your normal soap.             6.  Wash thoroughly, paying special attention to the area where your  surgery  will be performed.  7.  Thoroughly rinse your body with warm water from the neck down.  8.  DO NOT shower/wash with your normal soap after using and rinsing off the CHG Soap.            9.  Pat yourself dry with a clean towel.            10.  Wear clean pajamas.            11.  Place clean sheets on your bed the night of your first shower and do not  sleep with pets.  ON THE DAY OF SURGERY : Do not apply any lotions/deodorants the morning of surgery.  Please wear clean clothes to the hospital/surgery center.    FAILURE TO FOLLOW THESE INSTRUCTIONS MAY RESULT IN THE CANCELLATION OF YOUR SURGERY  PATIENT SIGNATURE_________________________________  NURSE SIGNATURE__________________________________  ________________________________________________________________________         Adam Phenix    An incentive spirometer is a tool that can help keep your lungs clear and active. This tool measures how well you are filling your lungs with each breath. Taking long deep breaths may help reverse or decrease the chance of developing breathing (pulmonary) problems (especially infection) following: A long period of time when you are unable to move or be active. BEFORE THE PROCEDURE  If the spirometer includes an indicator to show your best effort, your nurse or respiratory therapist will set it to a desired goal. If possible, sit up straight or lean slightly forward. Try not to slouch. Hold the incentive spirometer in an upright position. INSTRUCTIONS FOR USE  Sit on the edge of your bed if possible, or sit up as far as you can in bed or on a chair. Hold the incentive spirometer in an upright position. Breathe out normally. Place the mouthpiece in your  mouth and seal your lips tightly around it. Breathe in slowly and as deeply as possible, raising the piston or the ball toward the top of the column. Hold your breath for 3-5 seconds or for as long as possible. Allow the piston or ball to fall to the bottom of the column. Remove the mouthpiece from your mouth and breathe out normally. Rest for a few seconds and repeat Steps 1 through 7 at least 10 times every 1-2 hours when you are awake. Take your time and take a few normal breaths between deep breaths. The spirometer may include an indicator to show your best effort. Use the indicator as a goal to work toward during each repetition. After each set of 10 deep breaths, practice coughing to be sure your  lungs are clear. If you have an incision (the cut made at the time of surgery), support your incision when coughing by placing a pillow or rolled up towels firmly against it. Once you are able to get out of bed, walk around indoors and cough well. You may stop using the incentive spirometer when instructed by your caregiver.  RISKS AND COMPLICATIONS Take your time so you do not get dizzy or light-headed. If you are in pain, you may need to take or ask for pain medication before doing incentive spirometry. It is harder to take a deep breath if you are having pain. AFTER USE Rest and breathe slowly and easily. It can be helpful to keep track of a log of your progress. Your caregiver can provide you with a simple table to help with this. If you are using the spirometer at home, follow these instructions: [place] IF:  You are having difficultly using the spirometer. You have trouble using the spirometer as often as instructed. Your pain medication is not giving enough relief while using the spirometer. You develop fever of 100.5 F (38.1 C) or higher.                                                                                                    SEEK IMMEDIATE MEDICAL CARE IF:  You cough  up bloody sputum that had not been present before. You develop fever of 102 F (38.9 C) or greater. You develop worsening pain at or near the incision site. MAKE SURE YOU:  Understand these instructions. Will watch your condition. Will get help right away if you are not doing well or get worse. Document Released: 02/01/2007 Document Revised: 12/14/2011 Document Reviewed: 04/04/2007 [facility] Patient Information 2014 [place], [place].       WHAT IS A BLOOD TRANSFUSION? Blood Transfusion Information  A transfusion is the replacement of blood or some of its parts. Blood is made up of multiple cells which provide different functions. Red blood cells carry oxygen and are used for blood loss replacement. White blood cells fight against infection. Platelets control bleeding. Plasma helps clot blood. Other blood products are available for specialized needs, such as hemophilia or other clotting disorders. BEFORE THE TRANSFUSION  Who gives blood for transfusions?  Healthy volunteers who are fully evaluated to make sure their blood is safe. This is blood bank blood. Transfusion therapy is the safest it has ever been in the practice of medicine. Before blood is taken from a donor, a complete history is taken to make sure that person has no history of diseases nor engages in risky social behavior (examples are intravenous drug use or sexual activity with multiple partners). The donor's travel history is screened to minimize risk of transmitting infections, such as malaria. The donated blood is tested for signs of infectious diseases, such as HIV and hepatitis. The blood is then tested to be sure it is compatible with you in order to minimize the chance of a transfusion reaction. If you or a relative donates blood, this is often done in anticipation of surgery and  is not appropriate for emergency situations. It takes many days to process the donated blood. RISKS AND COMPLICATIONS Although transfusion  therapy is very safe and saves many lives, the main dangers of transfusion include:  Getting an infectious disease. Developing a transfusion reaction. This is an allergic reaction to something in the blood you were given. Every precaution is taken to prevent this. The decision to have a blood transfusion has been considered carefully by your caregiver before blood is given. Blood is not given unless the benefits outweigh the risks. AFTER THE TRANSFUSION Right after receiving a blood transfusion, you will usually feel much better and more energetic. This is especially true if your red blood cells have gotten low (anemic). The transfusion raises the level of the red blood cells which carry oxygen, and this usually causes an energy increase. The nurse administering the transfusion will monitor you carefully for complications. HOME CARE INSTRUCTIONS  No special instructions are needed after a transfusion. You may find your energy is better. Speak with your caregiver about any limitations on activity for underlying diseases you may have. SEEK MEDICAL CARE IF:  Your condition is not improving after your transfusion. You develop redness or irritation at the intravenous (IV) site. SEEK IMMEDIATE MEDICAL CARE IF:  Any of the following symptoms occur over the next 12 hours: Shaking chills. You have a temperature by mouth above 102 F (38.9 C), not controlled by medicine. Chest, back, or muscle pain. People around you feel you are not acting correctly or are confused. Shortness of breath or difficulty breathing. Dizziness and fainting. You get a rash or develop hives. You have a decrease in urine output. Your urine turns a dark color or changes to pink, red, or brown. Any of the following symptoms occur over the next 10 days: You have a temperature by mouth above 102 F (38.9 C), not controlled by medicine. Shortness of breath. Weakness after normal activity. The white part of the eye turns yellow  (jaundice). You have a decrease in the amount of urine or are urinating less often. Your urine turns a dark color or changes to pink, red, or brown. Document Released: 09/18/2000 Document Revised: 12/14/2011 Document Reviewed: 05/07/2008 [facility] Patient Information 2014 [place], [place].  _______________________________________________________________________

## 2022-08-31 ENCOUNTER — Encounter (HOSPITAL_COMMUNITY): Payer: Self-pay

## 2022-08-31 ENCOUNTER — Other Ambulatory Visit: Payer: Self-pay

## 2022-08-31 ENCOUNTER — Encounter (HOSPITAL_COMMUNITY)
Admission: RE | Admit: 2022-08-31 | Discharge: 2022-08-31 | Disposition: A | Discharge status: Home / Self Care | Payer: Medicare HMO | Source: Ambulatory Visit | Attending: Orthopedic Surgery | Admitting: Orthopedic Surgery

## 2022-08-31 VITALS — BP 166/73 | HR 84 | Temp 97.8°F | Resp 17 | Ht 64.0 in | Wt 158.0 lb

## 2022-08-31 DIAGNOSIS — I10 Essential (primary) hypertension: Secondary | ICD-10-CM | POA: Insufficient documentation

## 2022-08-31 DIAGNOSIS — Z01812 Encounter for preprocedural laboratory examination: Secondary | ICD-10-CM | POA: Insufficient documentation

## 2022-08-31 DIAGNOSIS — Z01818 Encounter for other preprocedural examination: Secondary | ICD-10-CM

## 2022-08-31 LAB — CBC
HCT: 41.7 % (ref 36.0–46.0)
Hemoglobin: 13.6 g/dL (ref 12.0–15.0)
MCH: 28.6 pg (ref 26.0–34.0)
MCHC: 32.6 g/dL (ref 30.0–36.0)
MCV: 87.6 fL (ref 80.0–100.0)
Platelets: 467 10*3/uL — ABNORMAL HIGH (ref 150–400)
RBC: 4.76 MIL/uL (ref 3.87–5.11)
RDW: 13 % (ref 11.5–15.5)
WBC: 8.5 10*3/uL (ref 4.0–10.5)
nRBC: 0 % (ref 0.0–0.2)

## 2022-08-31 LAB — SURGICAL PCR SCREEN
MRSA, PCR: NEGATIVE
Staphylococcus aureus: NEGATIVE

## 2022-08-31 LAB — BASIC METABOLIC PANEL
Anion gap: 10 (ref 5–15)
BUN: 20 mg/dL (ref 8–23)
CO2: 25 mmol/L (ref 22–32)
Calcium: 9.3 mg/dL (ref 8.9–10.3)
Chloride: 103 mmol/L (ref 98–111)
Creatinine, Ser: 0.94 mg/dL (ref 0.44–1.00)
GFR, Estimated: >60 mL/min (ref >60)
Glucose, Bld: 86 mg/dL (ref 70–99)
Potassium: 3.9 mmol/L (ref 3.5–5.1)
Sodium: 138 mmol/L (ref 135–145)

## 2022-09-02 ENCOUNTER — Inpatient Hospital Stay (HOSPITAL_COMMUNITY): Payer: Medicare HMO | Admitting: Certified Registered Nurse Anesthetist

## 2022-09-02 ENCOUNTER — Inpatient Hospital Stay (HOSPITAL_COMMUNITY): Payer: Medicare HMO | Admitting: Physician Assistant

## 2022-09-02 ENCOUNTER — Encounter (HOSPITAL_COMMUNITY): Payer: Self-pay | Admitting: Orthopedic Surgery

## 2022-09-02 ENCOUNTER — Inpatient Hospital Stay (HOSPITAL_COMMUNITY)
Admission: RE | Admit: 2022-09-02 | Discharge: 2022-09-05 | DRG: 468 | Disposition: A | Discharge status: Home / Self Care | Payer: Medicare HMO | Attending: Orthopedic Surgery | Admitting: Orthopedic Surgery

## 2022-09-02 ENCOUNTER — Encounter (HOSPITAL_COMMUNITY)
Admission: RE | Disposition: A | Discharge status: Home / Self Care | Payer: Self-pay | Source: Home / Self Care | Attending: Orthopedic Surgery

## 2022-09-02 ENCOUNTER — Other Ambulatory Visit: Payer: Self-pay

## 2022-09-02 DIAGNOSIS — Y792 Prosthetic and other implants, materials and accessory orthopedic devices associated with adverse incidents: Secondary | ICD-10-CM | POA: Diagnosis present

## 2022-09-02 DIAGNOSIS — Z90711 Acquired absence of uterus with remaining cervical stump: Secondary | ICD-10-CM | POA: Diagnosis not present

## 2022-09-02 DIAGNOSIS — T84018A Broken internal joint prosthesis, other site, initial encounter: Secondary | ICD-10-CM

## 2022-09-02 DIAGNOSIS — Z79899 Other long term (current) drug therapy: Secondary | ICD-10-CM | POA: Diagnosis not present

## 2022-09-02 DIAGNOSIS — Z8619 Personal history of other infectious and parasitic diseases: Secondary | ICD-10-CM

## 2022-09-02 DIAGNOSIS — Z888 Allergy status to other drugs, medicaments and biological substances status: Secondary | ICD-10-CM | POA: Diagnosis not present

## 2022-09-02 DIAGNOSIS — Z01818 Encounter for other preprocedural examination: Principal | ICD-10-CM

## 2022-09-02 DIAGNOSIS — N6019 Diffuse cystic mastopathy of unspecified breast: Secondary | ICD-10-CM | POA: Diagnosis present

## 2022-09-02 DIAGNOSIS — Z862 Personal history of diseases of the blood and blood-forming organs and certain disorders involving the immune mechanism: Secondary | ICD-10-CM | POA: Diagnosis not present

## 2022-09-02 DIAGNOSIS — T84093A Other mechanical complication of internal left knee prosthesis, initial encounter: Secondary | ICD-10-CM

## 2022-09-02 DIAGNOSIS — Z8249 Family history of ischemic heart disease and other diseases of the circulatory system: Secondary | ICD-10-CM

## 2022-09-02 DIAGNOSIS — Z96652 Presence of left artificial knee joint: Secondary | ICD-10-CM | POA: Diagnosis not present

## 2022-09-02 DIAGNOSIS — T8484XA Pain due to internal orthopedic prosthetic devices, implants and grafts, initial encounter: Secondary | ICD-10-CM | POA: Diagnosis present

## 2022-09-02 DIAGNOSIS — M549 Dorsalgia, unspecified: Secondary | ICD-10-CM | POA: Diagnosis present

## 2022-09-02 DIAGNOSIS — M1611 Unilateral primary osteoarthritis, right hip: Secondary | ICD-10-CM | POA: Diagnosis not present

## 2022-09-02 DIAGNOSIS — Z96659 Presence of unspecified artificial knee joint: Secondary | ICD-10-CM

## 2022-09-02 DIAGNOSIS — M26609 Unspecified temporomandibular joint disorder, unspecified side: Secondary | ICD-10-CM | POA: Diagnosis present

## 2022-09-02 DIAGNOSIS — I10 Essential (primary) hypertension: Secondary | ICD-10-CM | POA: Diagnosis not present

## 2022-09-02 DIAGNOSIS — T84033A Mechanical loosening of internal left knee prosthetic joint, initial encounter: Secondary | ICD-10-CM | POA: Diagnosis not present

## 2022-09-02 DIAGNOSIS — G8918 Other acute postprocedural pain: Secondary | ICD-10-CM | POA: Diagnosis not present

## 2022-09-02 DIAGNOSIS — Z881 Allergy status to other antibiotic agents status: Secondary | ICD-10-CM

## 2022-09-02 DIAGNOSIS — K759 Inflammatory liver disease, unspecified: Secondary | ICD-10-CM | POA: Diagnosis not present

## 2022-09-02 DIAGNOSIS — Z780 Asymptomatic menopausal state: Secondary | ICD-10-CM

## 2022-09-02 DIAGNOSIS — T84013A Broken internal left knee prosthesis, initial encounter: Secondary | ICD-10-CM | POA: Diagnosis not present

## 2022-09-02 HISTORY — PX: TOTAL KNEE REVISION: SHX996

## 2022-09-02 LAB — TYPE AND SCREEN
ABO/RH(D): B POS
Antibody Screen: NEGATIVE

## 2022-09-02 SURGERY — TOTAL KNEE REVISION
Anesthesia: Spinal | Site: Knee | Laterality: Left

## 2022-09-02 MED ORDER — PROPOFOL 10 MG/ML IV BOLUS
INTRAVENOUS | Status: DC | PRN
  Administered 2022-09-02: 120 mg via INTRAVENOUS

## 2022-09-02 MED ORDER — TRANEXAMIC ACID-NACL 1000-0.7 MG/100ML-% IV SOLN
1000.0000 mg | INTRAVENOUS | Status: AC
  Administered 2022-09-02: 1000 mg via INTRAVENOUS
  Filled 2022-09-02: qty 100

## 2022-09-02 MED ORDER — SODIUM CHLORIDE (PF) 0.9 % IJ SOLN
INTRAMUSCULAR | Status: AC
  Filled 2022-09-02: qty 50

## 2022-09-02 MED ORDER — SODIUM CHLORIDE 0.9 % IV SOLN: INTRAVENOUS | Status: DC

## 2022-09-02 MED ORDER — DIPHENHYDRAMINE HCL 12.5 MG/5ML PO ELIX: 12.5000 mg | ORAL_SOLUTION | ORAL | Status: DC | PRN

## 2022-09-02 MED ORDER — ORAL CARE MOUTH RINSE: 15.0000 mL | OROMUCOSAL | Status: DC | PRN

## 2022-09-02 MED ORDER — BISACODYL 10 MG RE SUPP: 10.0000 mg | Freq: Every day | RECTAL | Status: DC | PRN

## 2022-09-02 MED ORDER — ASPIRIN 325 MG PO TBEC
325.0000 mg | DELAYED_RELEASE_TABLET | Freq: Two times a day (BID) | ORAL | Status: DC
  Administered 2022-09-03 – 2022-09-05 (×5): 325 mg via ORAL
  Filled 2022-09-02 (×5): qty 1

## 2022-09-02 MED ORDER — ONDANSETRON HCL 4 MG/2ML IJ SOLN
INTRAMUSCULAR | Status: DC | PRN
  Administered 2022-09-02: 4 mg via INTRAVENOUS

## 2022-09-02 MED ORDER — BUPIVACAINE LIPOSOME 1.3 % IJ SUSP: 20.0000 mL | Freq: Once | INTRAMUSCULAR | Status: AC

## 2022-09-02 MED ORDER — METHOCARBAMOL 1000 MG/10ML IJ SOLN: 500.0000 mg | Freq: Four times a day (QID) | INTRAVENOUS | Status: DC | PRN

## 2022-09-02 MED ORDER — CHLORTHALIDONE 25 MG PO TABS
25.0000 mg | ORAL_TABLET | Freq: Every day | ORAL | Status: DC
  Administered 2022-09-03 – 2022-09-05 (×3): 25 mg via ORAL
  Filled 2022-09-02 (×3): qty 1

## 2022-09-02 MED ORDER — METOCLOPRAMIDE HCL 5 MG PO TABS: 5.0000 mg | ORAL_TABLET | Freq: Three times a day (TID) | ORAL | Status: DC | PRN

## 2022-09-02 MED ORDER — ONDANSETRON HCL 4 MG/2ML IJ SOLN
4.0000 mg | Freq: Four times a day (QID) | INTRAMUSCULAR | Status: DC | PRN
  Administered 2022-09-04: 4 mg via INTRAVENOUS
  Filled 2022-09-02: qty 2

## 2022-09-02 MED ORDER — FLEET ENEMA 7-19 GM/118ML RE ENEM: 1.0000 | ENEMA | Freq: Once | RECTAL | Status: DC | PRN

## 2022-09-02 MED ORDER — DOCUSATE SODIUM 100 MG PO CAPS
100.0000 mg | ORAL_CAPSULE | Freq: Two times a day (BID) | ORAL | Status: DC
  Administered 2022-09-02 – 2022-09-05 (×5): 100 mg via ORAL
  Filled 2022-09-02 (×5): qty 1

## 2022-09-02 MED ORDER — SPIRONOLACTONE 25 MG PO TABS
25.0000 mg | ORAL_TABLET | Freq: Every day | ORAL | Status: DC
  Administered 2022-09-02 – 2022-09-05 (×4): 25 mg via ORAL
  Filled 2022-09-02 (×4): qty 1

## 2022-09-02 MED ORDER — FENTANYL CITRATE (PF) 100 MCG/2ML IJ SOLN
INTRAMUSCULAR | Status: DC | PRN
  Administered 2022-09-02: 100 ug via INTRAVENOUS
  Administered 2022-09-02 (×2): 50 ug via INTRAVENOUS

## 2022-09-02 MED ORDER — TRAMADOL HCL 50 MG PO TABS
50.0000 mg | ORAL_TABLET | Freq: Four times a day (QID) | ORAL | Status: DC | PRN
  Administered 2022-09-03 – 2022-09-04 (×4): 100 mg via ORAL
  Filled 2022-09-02 (×5): qty 2

## 2022-09-02 MED ORDER — CHLORHEXIDINE GLUCONATE 0.12 % MT SOLN
15.0000 mL | Freq: Once | OROMUCOSAL | Status: AC
  Administered 2022-09-02: 15 mL via OROMUCOSAL

## 2022-09-02 MED ORDER — ORAL CARE MOUTH RINSE: 15.0000 mL | Freq: Once | OROMUCOSAL | Status: AC

## 2022-09-02 MED ORDER — CEFAZOLIN SODIUM-DEXTROSE 2-4 GM/100ML-% IV SOLN
2.0000 g | Freq: Four times a day (QID) | INTRAVENOUS | Status: AC
  Administered 2022-09-02 – 2022-09-03 (×2): 2 g via INTRAVENOUS
  Filled 2022-09-02 (×2): qty 100

## 2022-09-02 MED ORDER — MORPHINE SULFATE (PF) 2 MG/ML IV SOLN: 0.5000 mg | INTRAVENOUS | Status: DC | PRN

## 2022-09-02 MED ORDER — AMLODIPINE BESYLATE 5 MG PO TABS
5.0000 mg | ORAL_TABLET | Freq: Every day | ORAL | Status: DC
  Administered 2022-09-03 – 2022-09-04 (×2): 5 mg via ORAL
  Filled 2022-09-02 (×2): qty 1

## 2022-09-02 MED ORDER — SODIUM CHLORIDE 0.9 % IR SOLN
Status: DC | PRN
  Administered 2022-09-02: 1000 mL

## 2022-09-02 MED ORDER — BUPIVACAINE LIPOSOME 1.3 % IJ SUSP
INTRAMUSCULAR | Status: DC | PRN
  Administered 2022-09-02: 20 mL

## 2022-09-02 MED ORDER — LIDOCAINE 2% (20 MG/ML) 5 ML SYRINGE
INTRAMUSCULAR | Status: DC | PRN
  Administered 2022-09-02: 60 mg via INTRAVENOUS

## 2022-09-02 MED ORDER — FENTANYL CITRATE (PF) 250 MCG/5ML IJ SOLN
INTRAMUSCULAR | Status: AC
  Filled 2022-09-02: qty 5

## 2022-09-02 MED ORDER — POVIDONE-IODINE 10 % EX SWAB
2.0000 | Freq: Once | CUTANEOUS | Status: AC
  Administered 2022-09-02: 2 via TOPICAL

## 2022-09-02 MED ORDER — METOCLOPRAMIDE HCL 5 MG/ML IJ SOLN: 5.0000 mg | Freq: Three times a day (TID) | INTRAMUSCULAR | Status: DC | PRN

## 2022-09-02 MED ORDER — LACTATED RINGERS IV SOLN: INTRAVENOUS | Status: DC

## 2022-09-02 MED ORDER — POTASSIUM CHLORIDE CRYS ER 10 MEQ PO TBCR
10.0000 meq | EXTENDED_RELEASE_TABLET | Freq: Every day | ORAL | Status: DC
  Administered 2022-09-02 – 2022-09-05 (×4): 10 meq via ORAL
  Filled 2022-09-02 (×4): qty 1

## 2022-09-02 MED ORDER — ACETAMINOPHEN 500 MG PO TABS
1000.0000 mg | ORAL_TABLET | Freq: Four times a day (QID) | ORAL | Status: AC
  Administered 2022-09-02 – 2022-09-03 (×4): 1000 mg via ORAL
  Filled 2022-09-02 (×4): qty 2

## 2022-09-02 MED ORDER — PHENOL 1.4 % MT LIQD: 1.0000 | OROMUCOSAL | Status: DC | PRN

## 2022-09-02 MED ORDER — SODIUM CHLORIDE (PF) 0.9 % IJ SOLN
INTRAMUSCULAR | Status: AC
  Filled 2022-09-02: qty 10

## 2022-09-02 MED ORDER — OXYCODONE HCL 5 MG PO TABS: 5.0000 mg | ORAL_TABLET | ORAL | Status: DC | PRN

## 2022-09-02 MED ORDER — 0.9 % SODIUM CHLORIDE (POUR BTL) OPTIME
TOPICAL | Status: DC | PRN
  Administered 2022-09-02: 1000 mL

## 2022-09-02 MED ORDER — BUPIVACAINE-EPINEPHRINE (PF) 0.5% -1:200000 IJ SOLN
INTRAMUSCULAR | Status: DC | PRN
  Administered 2022-09-02: 20 mL via PERINEURAL

## 2022-09-02 MED ORDER — BUPIVACAINE LIPOSOME 1.3 % IJ SUSP
INTRAMUSCULAR | Status: AC
  Filled 2022-09-02: qty 20

## 2022-09-02 MED ORDER — DEXAMETHASONE SODIUM PHOSPHATE 10 MG/ML IJ SOLN
8.0000 mg | Freq: Once | INTRAMUSCULAR | Status: AC
  Administered 2022-09-02: 8 mg via INTRAVENOUS

## 2022-09-02 MED ORDER — MIDAZOLAM HCL 2 MG/2ML IJ SOLN
1.0000 mg | INTRAMUSCULAR | Status: AC
  Administered 2022-09-02: 1 mg via INTRAVENOUS
  Filled 2022-09-02: qty 2

## 2022-09-02 MED ORDER — MENTHOL 3 MG MT LOZG: 1.0000 | LOZENGE | OROMUCOSAL | Status: DC | PRN

## 2022-09-02 MED ORDER — METHOCARBAMOL 500 MG PO TABS
500.0000 mg | ORAL_TABLET | Freq: Four times a day (QID) | ORAL | Status: DC | PRN
  Filled 2022-09-02: qty 1

## 2022-09-02 MED ORDER — FENTANYL CITRATE PF 50 MCG/ML IJ SOSY
50.0000 ug | PREFILLED_SYRINGE | INTRAMUSCULAR | Status: AC
  Administered 2022-09-02: 50 ug via INTRAVENOUS
  Filled 2022-09-02: qty 2

## 2022-09-02 MED ORDER — ONDANSETRON HCL 4 MG PO TABS: 4.0000 mg | ORAL_TABLET | Freq: Four times a day (QID) | ORAL | Status: DC | PRN

## 2022-09-02 MED ORDER — POLYETHYLENE GLYCOL 3350 17 G PO PACK: 17.0000 g | PACK | Freq: Every day | ORAL | Status: DC | PRN

## 2022-09-02 MED ORDER — STERILE WATER FOR IRRIGATION IR SOLN
Status: DC | PRN
  Administered 2022-09-02: 2000 mL

## 2022-09-02 MED ORDER — CEFAZOLIN SODIUM-DEXTROSE 2-4 GM/100ML-% IV SOLN
2.0000 g | INTRAVENOUS | Status: AC
  Administered 2022-09-02: 2 g via INTRAVENOUS
  Filled 2022-09-02: qty 100

## 2022-09-02 MED ORDER — ACETAMINOPHEN 10 MG/ML IV SOLN
1000.0000 mg | Freq: Four times a day (QID) | INTRAVENOUS | Status: DC
  Administered 2022-09-02: 1000 mg via INTRAVENOUS
  Filled 2022-09-02: qty 100

## 2022-09-02 MED ORDER — SODIUM CHLORIDE (PF) 0.9 % IJ SOLN
INTRAMUSCULAR | Status: DC | PRN
  Administered 2022-09-02: 60 mL

## 2022-09-02 SURGICAL SUPPLY — 58 items
AUG FEM SZ4 4 REV DIST STRL LF (Miscellaneous) ×2 IMPLANT
AUG FEM SZ4 4 REV POST STRL LF (Miscellaneous) ×2 IMPLANT
AUG TIB .75 UNV 5 REV KN (Joint) ×2 IMPLANT
AUG TIB ATTUNE UNV SZ3 4X5 (Joint) ×2 IMPLANT
AUGMENT DISTAL FEM SZ4 4 KNEE (Miscellaneous) IMPLANT
AUGMENT POST FEM SZ4 4 (Miscellaneous) IMPLANT
AUGMENT TIB ATTUNE UNV SZ3 4X5 (Joint) IMPLANT
BAG COUNTER SPONGE SURGICOUNT (BAG) IMPLANT
BAG SPNG CNTER NS LX DISP (BAG)
BLADE SAG 18X100X1.27 (BLADE) ×1 IMPLANT
BLADE SAW SGTL 11.0X1.19X90.0M (BLADE) ×1 IMPLANT
BNDG ELASTIC 6X5.8 VLCR STR LF (GAUZE/BANDAGES/DRESSINGS) ×1 IMPLANT
BONE CEMENT GENTAMICIN (Cement) ×2 IMPLANT
BSPLAT TIB 4 CMNT REV FX BRNG (Insert) ×1 IMPLANT
CEMENT BONE GENTAMICIN 40 (Cement) ×3 IMPLANT
COMP FEM ATTUNE CRS SZ4 LT (Femur) ×1 IMPLANT
COMPONENT FEM ATN CR SZ4 LT (Femur) IMPLANT
COVER SURGICAL LIGHT HANDLE (MISCELLANEOUS) ×1 IMPLANT
CUFF TOURN SGL QUICK 34 (TOURNIQUET CUFF) ×1
CUFF TRNQT CYL 34X4.125X (TOURNIQUET CUFF) ×1 IMPLANT
DRAPE INCISE IOBAN 66X45 STRL (DRAPES) ×1 IMPLANT
DRAPE U-SHAPE 47X51 STRL (DRAPES) ×1 IMPLANT
DRSG ADAPTIC 3X8 NADH LF (GAUZE/BANDAGES/DRESSINGS) ×1 IMPLANT
DRSG AQUACEL AG ADV 3.5X10 (GAUZE/BANDAGES/DRESSINGS) IMPLANT
DURAPREP 26ML APPLICATOR (WOUND CARE) ×1 IMPLANT
ELECT REM PT RETURN 15FT ADLT (MISCELLANEOUS) ×1 IMPLANT
EVACUATOR 1/8 PVC DRAIN (DRAIN) IMPLANT
GAUZE PAD ABD 8X10 STRL (GAUZE/BANDAGES/DRESSINGS) ×1 IMPLANT
GAUZE SPONGE 4X4 12PLY STRL (GAUZE/BANDAGES/DRESSINGS) ×1 IMPLANT
GLOVE BIO SURGEON STRL SZ 6.5 (GLOVE) ×2 IMPLANT
GLOVE BIO SURGEON STRL SZ8 (GLOVE) ×2 IMPLANT
GLOVE BIOGEL PI IND STRL 8 (GLOVE) ×1 IMPLANT
GOWN STRL REUS W/ TWL LRG LVL3 (GOWN DISPOSABLE) ×1 IMPLANT
GOWN STRL REUS W/TWL LRG LVL3 (GOWN DISPOSABLE) ×2
HANDPIECE INTERPULSE COAX TIP (DISPOSABLE) ×1
IMMOBILIZER KNEE 20 (SOFTGOODS) ×1
IMMOBILIZER KNEE 20 THIGH 36 (SOFTGOODS) ×1 IMPLANT
INSERT TIB ATTUNE FB SZ4X12 (Insert) IMPLANT
INSERT TIBIAL FIX BEARING SZ4 (Insert) IMPLANT
KIT TURNOVER KIT A (KITS) IMPLANT
MANIFOLD NEPTUNE II (INSTRUMENTS) ×1 IMPLANT
NS IRRIG 1000ML POUR BTL (IV SOLUTION) ×1 IMPLANT
PACK TOTAL KNEE CUSTOM (KITS) ×1 IMPLANT
PADDING CAST COTTON 6X4 STRL (CAST SUPPLIES) ×2 IMPLANT
PROTECTOR NERVE ULNAR (MISCELLANEOUS) ×1 IMPLANT
SET HNDPC FAN SPRY TIP SCT (DISPOSABLE) ×1 IMPLANT
STEM STR ATTUNE PF 16X110 (Knees) IMPLANT
STEM STR ATTUNE PF 16X60 (Knees) IMPLANT
STRIP CLOSURE SKIN 1/2X4 (GAUZE/BANDAGES/DRESSINGS) ×1 IMPLANT
SUT MNCRL AB 4-0 PS2 18 (SUTURE) ×1 IMPLANT
SUT STRATAFIX 0 PDS 27 VIOLET (SUTURE) ×1
SUT VIC AB 2-0 CT1 27 (SUTURE) ×3
SUT VIC AB 2-0 CT1 TAPERPNT 27 (SUTURE) ×3 IMPLANT
SUTURE STRATFX 0 PDS 27 VIOLET (SUTURE) ×1 IMPLANT
TOWER CARTRIDGE SMART MIX (DISPOSABLE) ×1 IMPLANT
TUBE SUCTION HIGH CAP CLEAR NV (SUCTIONS) ×1 IMPLANT
WATER STERILE IRR 1000ML POUR (IV SOLUTION) ×1 IMPLANT
WRAP KNEE MAXI GEL POST OP (GAUZE/BANDAGES/DRESSINGS) IMPLANT

## 2022-09-02 NOTE — Transfer of Care (Signed)
Immediate Anesthesia Transfer of Care Note  Patient: Katie Vasquez  Procedure(s) Performed: Left total knee arthroplasty revision (Left: Knee)  Patient Location: PACU  Anesthesia Type:General  Level of Consciousness: sedated, patient cooperative, and responds to stimulation  Airway & Oxygen Therapy: Patient Spontanous Breathing and Patient connected to face mask oxygen  Post-op Assessment: Report given to RN and Post -op Vital signs reviewed and stable  Post vital signs: Reviewed and stable  Last Vitals:  Vitals Value Taken Time  BP 141/68 09/02/22 1700  Temp    Pulse 58 09/02/22 1702  Resp 14 09/02/22 1702  SpO2 100 % 09/02/22 1702  Vitals shown include unvalidated device data.  Last Pain:  Vitals:   09/02/22 1445  TempSrc:   PainSc: 0-No pain         Complications: No notable events documented.

## 2022-09-02 NOTE — Discharge Instructions (Signed)
Katie Arabian, MD Total Joint Specialist [facility] [address], [address] [place], [place] [address] [phone]  TOTAL KNEE REVISION POSTOPERATIVE DIRECTIONS  Knee Rehabilitation, Guidelines Following Surgery  Results after knee surgery are often greatly improved when you follow the exercise, range of motion and muscle strengthening exercises prescribed by your doctor. Safety measures are also important to protect the knee from further injury. If any of these exercises cause you to have increased pain or swelling in your knee joint, decrease the amount until you are comfortable again and slowly increase them. If you have problems or questions, call your caregiver or physical therapist for advice.   BLOOD CLOT PREVENTION Take a 325 mg Aspirin two times a day for three weeks following surgery. Then take an 81 mg Aspirin once a day for three weeks. Then discontinue Aspirin. You may resume your vitamins/supplements upon discharge from the hospital. Do not take any NSAIDs (Advil, Aleve, Ibuprofen, Meloxicam, etc.) until you have discontinued the 325 mg Aspirin.  HOME CARE INSTRUCTIONS  Remove items at home which could result in a fall. This includes throw rugs or furniture in walking pathways.  ICE to the affected knee as much as tolerated. Icing helps control swelling. If the swelling is well controlled you will be more comfortable and rehab easier. Continue to use ice on the knee for pain and swelling from surgery. You may notice swelling that will progress down to the foot and ankle. This is normal after surgery. Elevate the leg when you are not up walking on it.    Continue to use the breathing machine which will help keep your temperature down. It is common for your temperature to cycle up and down following surgery, especially at night when you are not up moving around and exerting yourself. The breathing machine keeps your lungs expanded and your temperature down. Do  not place pillow under the operative knee, focus on keeping the knee straight while resting  DIET You may resume your previous home diet once you are discharged from the hospital.  DRESSING / WOUND CARE / SHOWERING Keep your bulky bandage on for 2 days. On the third post-operative day you may remove the Ace bandage and gauze. There is a waterproof adhesive bandage on your skin which will stay in place until your first follow-up appointment. Once you remove this you will not need to place another bandage You may begin showering 3 days following surgery, but do not submerge the incision under water.  ACTIVITY For the first 5 days, the key is rest and control of pain and swelling Do your home exercises twice a day starting on post-operative day 3. On the days you go to physical therapy, just do the home exercises once that day. You should rest, ice and elevate the leg for 50 minutes out of every hour. Get up and walk/stretch for 10 minutes per hour. After 5 days you can increase your activity slowly as tolerated. Walk with your walker as instructed. Use the walker until you are comfortable transitioning to a cane. Walk with the cane in the opposite hand of the operative leg. You may discontinue the cane once you are comfortable and walking steadily. Avoid periods of inactivity such as sitting longer than an hour when not asleep. This helps prevent blood clots.  You may discontinue the knee immobilizer once you are able to perform a straight leg raise while lying down. You may resume a sexual relationship in one month or when  given the OK by your doctor.  You may return to work once you are cleared by your doctor.  Do not drive a car for 6 weeks or until released by your surgeon.  Do not drive while taking narcotics.  TED HOSE STOCKINGS Wear the elastic stockings on both legs for three weeks following surgery during the day. You may remove them at night for sleeping.  WEIGHT BEARING Weight  bearing as tolerated with assist device (walker, cane, etc) as directed, use it as long as suggested by your surgeon or therapist, typically at least 4-6 weeks.  POSTOPERATIVE CONSTIPATION PROTOCOL Constipation - defined medically as fewer than three stools per week and severe constipation as less than one stool per week.  One of the most common issues patients have following surgery is constipation.  Even if you have a regular bowel pattern at home, your normal regimen is likely to be disrupted due to multiple reasons following surgery.  Combination of anesthesia, postoperative narcotics, change in appetite and fluid intake all can affect your bowels.  In order to avoid complications following surgery, here are some recommendations in order to help you during your recovery period.  Colace (docusate) - Pick up an over-the-counter form of Colace or another stool softener and take twice a day as long as you are requiring postoperative pain medications.  Take with a full glass of water daily.  If you experience loose stools or diarrhea, hold the colace until you stool forms back up. If your symptoms do not get better within 1 week or if they get worse, check with your doctor. Dulcolax (bisacodyl) - Pick up over-the-counter and take as directed by the product packaging as needed to assist with the movement of your bowels.  Take with a full glass of water.  Use this product as needed if not relieved by Colace only.  MiraLax (polyethylene glycol) - Pick up over-the-counter to have on hand. MiraLax is a solution that will increase the amount of water in your bowels to assist with bowel movements.  Take as directed and can mix with a glass of water, juice, soda, coffee, or tea. Take if you go more than two days without a movement. Do not use MiraLax more than once per day. Call your doctor if you are still constipated or irregular after using this medication for 7 days in a row.  If you continue to have problems  with postoperative constipation, please contact the office for further assistance and recommendations.  If you experience "the worst abdominal pain ever" or develop nausea or vomiting, please contact the office immediatly for further recommendations for treatment.  ITCHING If you experience itching with your medications, try taking only a single pain pill, or even half a pain pill at a time.  You can also use Benadryl over the counter for itching or also to help with sleep.   MEDICATIONS See your medication summary on the "After Visit Summary" that the nursing staff will review with you prior to discharge.  You may have some home medications which will be placed on hold until you complete the course of blood thinner medication.  It is important for you to complete the blood thinner medication as prescribed by your surgeon.  Continue your approved medications as instructed at time of discharge.  PRECAUTIONS If you experience chest pain or shortness of breath - call 911 immediately for transfer to the hospital emergency department.  If you develop a fever greater that 101 F, purulent drainage  from wound, increased redness or drainage from wound, foul odor from the wound/dressing, or calf pain - CONTACT YOUR SURGEON.                                                   FOLLOW-UP APPOINTMENTS Make sure you keep all of your appointments after your operation with your surgeon and caregivers. You should call the office at the above phone number and make an appointment for approximately two weeks after the date of your surgery or on the date instructed by your surgeon outlined in the "After Visit Summary".  RANGE OF MOTION AND STRENGTHENING EXERCISES  Rehabilitation of the knee is important following a knee injury or an operation. After just a few days of immobilization, the muscles of the thigh which control the knee become weakened and shrink (atrophy). Knee exercises are designed to build up the tone and  strength of the thigh muscles and to improve knee motion. Often times heat used for twenty to thirty minutes before working out will loosen up your tissues and help with improving the range of motion but do not use heat for the first two weeks following surgery. These exercises can be done on a training (exercise) mat, on the floor, on a table or on a bed. Use what ever works the best and is most comfortable for you Knee exercises include:  Leg Lifts - While your knee is still immobilized in a splint or cast, you can do straight leg raises. Lift the leg to 60 degrees, hold for 3 sec, and slowly lower the leg. Repeat 10-20 times 2-3 times daily. Perform this exercise against resistance later as your knee gets better.  Quad and Hamstring Sets - Tighten up the muscle on the front of the thigh (Quad) and hold for 5-10 sec. Repeat this 10-20 times hourly. Hamstring sets are done by pushing the foot backward against an object and holding for 5-10 sec. Repeat as with quad sets.  Leg Slides: Lying on your back, slowly slide your foot toward your buttocks, bending your knee up off the floor (only go as far as is comfortable). Then slowly slide your foot back down until your leg is flat on the floor again. Angel Wings: Lying on your back spread your legs to the side as far apart as you can without causing discomfort.  A rehabilitation program following serious knee injuries can speed recovery and prevent re-injury in the future due to weakened muscles. Contact your doctor or a physical therapist for more information on knee rehabilitation.   POST-OPERATIVE OPIOID TAPER INSTRUCTIONS: It is important to wean off of your opioid medication as soon as possible. If you do not need pain medication after your surgery it is ok to stop day one. Opioids include: Codeine, Hydrocodone(Norco, Vicodin), Oxycodone(Percocet, oxycontin) and hydromorphone amongst others.  Long term and even short term use of opiods can  cause: Increased pain response Dependence Constipation Depression Respiratory depression And more.  Withdrawal symptoms can include Flu like symptoms Nausea, vomiting And more Techniques to manage these symptoms Hydrate well Eat regular healthy meals Stay active Use relaxation techniques(deep breathing, meditating, yoga) Do Not substitute Alcohol to help with tapering If you have been on opioids for less than two weeks and do not have pain than it is ok to stop all together.  Plan to wean  off of opioids This plan should start within one week post op of your joint replacement. Maintain the same interval or time between taking each dose and first decrease the dose.  Cut the total daily intake of opioids by one tablet each day Next start to increase the time between doses. The last dose that should be eliminated is the evening dose.   IF YOU ARE TRANSFERRED TO A SKILLED REHAB FACILITY If the patient is transferred to a skilled rehab facility following release from the hospital, a list of the current medications will be sent to the facility for the patient to continue.  When discharged from the skilled rehab facility, please have the facility set up the patient's [place] prior to being released. Also, the skilled facility will be responsible for providing the patient with their medications at time of release from the facility to include their pain medication, the muscle relaxants, and their blood thinner medication. If the patient is still at the rehab facility at time of the two week follow up appointment, the skilled rehab facility will also need to assist the patient in arranging follow up appointment in our office and any transportation needs.  MAKE SURE YOU:  Understand these instructions.  Get help right away if you are not doing well or get worse.   DENTAL ANTIBIOTICS:  In most cases prophylactic antibiotics for Dental procdeures after total joint surgery are  not necessary.  Exceptions are as follows:  1. History of prior total joint infection  2. Severely immunocompromised (Organ Transplant, cancer chemotherapy, Rheumatoid biologic meds such as [place])  3. Poorly controlled diabetes (A1C &gt; 8.0, blood glucose over 200)  If you have one of these conditions, contact your surgeon for an antibiotic prescription, prior to your dental procedure.    Pick up stool softner and laxative for home use following surgery while on pain medications. Do not submerge incision under water. Please use good hand washing techniques while changing dressing each day. May shower starting three days after surgery. Please use a clean towel to pat the incision dry following showers. Continue to use ice for pain and swelling after surgery. Do not use any lotions or creams on the incision until instructed by your surgeon.

## 2022-09-02 NOTE — Interval H&P Note (Signed)
History and Physical Interval Note:  09/02/2022 1:19 PM  Katie Vasquez  has presented today for surgery, with the diagnosis of Failed polyethylene left total knee arthroplasty.  The various methods of treatment have been discussed with the patient and family. After consideration of risks, benefits and other options for treatment, the patient has consented to  Procedure(s): Left knee polyethylene versus total knee arthroplasty revision (Left) as a surgical intervention.  The patient's history has been reviewed, patient examined, no change in status, stable for surgery.  I have reviewed the patient's chart and labs.  Questions were answered to the patient's satisfaction.     Pilar Plate Joretta Eads

## 2022-09-02 NOTE — Brief Op Note (Signed)
09/02/2022  4:34 PM  PATIENT:  Katie Vasquez  81 y.o. female  PRE-OPERATIVE DIAGNOSIS:  Failed polyethylene left total knee arthroplasty  POST-OPERATIVE DIAGNOSIS:  Failed polyethylene left total knee arthroplasty  PROCEDURE:  Procedure(s): Left total knee arthroplasty revision (Left)  SURGEON:  Surgeon(s) and Role:    Gaynelle Arabian, MD - Primary  PHYSICIAN ASSISTANT:   ASSISTANTS: Jaynie Bream, PA-C   ANESTHESIA:   regional and general  EBL:  25 mL   BLOOD ADMINISTERED:none  DRAINS: none   LOCAL MEDICATIONS USED:  OTHER Exparel  COUNTS:  YES  TOURNIQUET:   Total Tourniquet Time Documented: Thigh (Left) - 78 minutes Total: Thigh (Left) - 78 minutes   DICTATION: .Other Dictation: Dictation Number [id number]  PLAN OF CARE: Admit to inpatient   PATIENT DISPOSITION:  PACU - hemodynamically stable.

## 2022-09-02 NOTE — Anesthesia Preprocedure Evaluation (Addendum)
Anesthesia Evaluation  Patient identified by MRN, date of birth, ID band Patient awake  General Assessment Comment:History noted Dr. Nyoka Cowden  Reviewed: Allergy & Precautions, NPO status , Patient's Chart, lab work & pertinent test results  Airway Mallampati: II       Dental   Pulmonary shortness of breath   breath sounds clear to auscultation       Cardiovascular hypertension,  Rhythm:Regular Rate:Normal     Neuro/Psych  PSYCHIATRIC DISORDERS         GI/Hepatic negative GI ROS, Neg liver ROS,,,(+) Hepatitis -  Endo/Other  negative endocrine ROS    Renal/GU negative Renal ROS     Musculoskeletal  (+) Arthritis ,    Abdominal   Peds  Hematology   Anesthesia Other Findings   Reproductive/Obstetrics                             Anesthesia Physical Anesthesia Plan  ASA: 3  Anesthesia Plan: General   Post-op Pain Management: Tylenol PO (pre-op)* and Regional block*   Induction: Intravenous  PONV Risk Score and Plan: 2 and Ondansetron, Dexamethasone and Midazolam  Airway Management Planned: LMA  Additional Equipment:   Intra-op Plan:   Post-operative Plan: Extubation in OR  Informed Consent: I have reviewed the patients History and Physical, chart, labs and discussed the procedure including the risks, benefits and alternatives for the proposed anesthesia with the patient or authorized representative who has indicated his/her understanding and acceptance.     Dental advisory given  Plan Discussed with: CRNA, Anesthesiologist and Surgeon  Anesthesia Plan Comments:        Anesthesia Quick Evaluation

## 2022-09-02 NOTE — Op Note (Unsigned)
NAMELEANE, LORING MEDICAL RECORD NO: [id number] ACCOUNT NO: [id number] DATE OF BIRTH: October 17, 1940 FACILITY: [facility] LOCATION: [id number] PHYSICIAN: Dione Plover. Izzabell Klasen, MD  Operative Report   DATE OF PROCEDURE: 09/02/2022  PREOPERATIVE DIAGNOSIS:  Failed left total knee arthroplasty.  POSTOPERATIVE DIAGNOSIS:  Failed left total knee arthroplasty.  PROCEDURE:  Left total knee arthroplasty revision.  SURGEON:  Dione Plover. Alexzavier Girardin, MD  ASSISTANT:  Jaynie Bream, PA-C.  ANESTHESIA:  Adductor canal block and general.  ESTIMATED BLOOD LOSS:  25 mL.  DRAINS:  None.  TOURNIQUET TIME:  78 minutes at 300 mmHg.  COMPLICATIONS:  None.  CONDITION:  Stable to recovery.  BRIEF CLINICAL NOTE:  The patient is an 81 year old female who had a left total knee arthroplasty done approximately 23 years ago and has done great until the past year when she has noticed increased pain and laxity in the knee.  She had a negative  infection workup, negative bone scan for loosening.  It was felt that she had polyethylene wear leading to the pain and laxity.  She presents now for polyethylene versus total knee revision.  Of note, she had a previous patellectomy many years ago  predating her knee replacement.  PROCEDURE IN DETAIL:  After successful administration of adequate canal block and general anesthetic, a tourniquet was placed high on her left thigh and her left lower extremity was prepped and draped in the usual sterile fashion.  Extremity is wrapped  in Esmarch, tourniquet inflated to 300 mmHg.  Midline incision was made with a 10 blade through subcutaneous tissue to the level of the extensor mechanism.  Arthrotomy was made with a fresh blade.  Soft tissue over the proximal medial tibia  subperiosteally elevated the joint line with a knife and into the semimembranosus bursa with a Cobb elevator.  Soft tissue laterally was elevated with attention being paid to avoid the patellar tendon on tibial tubercle.  The  lateral tissues retracted  laterally.  Knee was flexed 90 degrees.  She had a fixed bearing tibia, so I removed the polyethylene from the tibial tray.  Of note, the polyethylene was in excellent condition without any abnormal wear.  I sewed that, but there appeared to be a gap  between the anterior flange of the femoral component and the anterior aspect of the femur.  I placed an osteotome at that interface and the femur was grossly loose.  It completely debonded from the cement and was grossly loose.  We removed the femoral  component.  We then retracted the tibia anteriorly and placed circumferential retraction to gain adequate exposure for the tibia.  Oscillating saw was used to disrupt the interface between the tibial component and bone and a tibial component was removed  with minimal if any bone loss.  Cement was then removed from the tibial canal.  Extramedullary tibial alignment guide was placed, referencing proximally at the medial aspect of the tibial tubercle and distally along the second metatarsal axis and tibial  crest.  Block is pinned to remove 2 mm of the previous resection level.  Size 4 is the most appropriate tibial component for the Attune revision.  We reamed the canal up to a 16 mm for a 60 mm press-fit stem, this was a 16 x 60.  Proximally, we prepared  with the modular drill and keel punch for the size 4.  Given that we had to resect about 5 mm altogether, I decided to use 5 mm augments medial and lateral on the tibia  and the trial to build the joint line back up.  We removed the cement from the distal femur.  We then accessed the femoral canal and thoroughly irrigated it.  I reamed up to 16 mm, which had a great press-fit.  This was for a 16 x 110 stem.  We did our distal femoral resection cut off utilizing the  stem as an alignment rod.  2 mm were taken distally.  We decided to place 4 mm augments distally to build the joint line back to more appropriate position.  The size 4 is  also most appropriate for the femur.  Size 4 cutting block is placed with the 16 x  110 stem extension on it.  Rotation is marked off the epicondylar axis and confirmed by creating a rectangular flexion gap at 90 degrees.  Anterior cuts made.  There was minimal bone resection there.  Posteriorly, I had to go in a +4 position to get any  bone at all, thus 4 mm augments were needed posteriorly, both medial and lateral.  We placed the intercondylar cutting guide and intercondylar cut was made for revision femur.  Trials were then built on the femoral side, it is the size 4 Attune revision  femur with a 4 mm augments distally, both medial and lateral and 4 mm augments posteriorly, both medial and lateral.  The stem is a 16 x 110 in the 5-degree valgus position.  On the tibial side, the trial is a size 4 Attune revision tibial tray with a 16  x 60 stem press fit and with 5 mm augments medial and lateral.  The trials were placed and with a 12 mm insert, full extension was achieved with excellent varus, valgus, anterior, posterior balance throughout full range of motion.  She did not have the  patella, thus we did not need to do any type of patellar revision.  At this point, the components were then assembled on the back table.  Trials were removed from the knee and cut bone surfaces were prepared with pulsatile lavage.  Two batches of  gentamicin impregnated cement were mixed and once ready for implantation, the tibia cemented at the resection level, but not in the canal was used to press-fit stem.  It was impacted with excellent purchase and all extruded cement removed.  On the  femoral side, we cemented distally and again it is a press-fit stem.  Everything was impacted and all extruded cement removed.  Trial 12 mm insert was placed, knee held in full extension and any additional extruded cement was removed.  20 mL of Exparel  mixed with 60 mL of saline was injected into the extensor mechanism, periosteum of the  femur and subcutaneous tissues.  Once the cement was fully hardened, then the trial inserts were removed and the permanent 12 mm posterior stabilized fixed bearing  insert was impacted into the Attune tibial fixed bearing tray.  It locked into position.  Full extension was achieved with excellent balance throughout full range of motion.  Wound was copiously irrigated with saline solution and the arthrotomy closed  with a running #0 Stratafix suture.  Flexion against gravity was 135 degrees with outstanding stability.  Tourniquet was released after total time of 78 minutes.  Minor bleeding stopped with electrocautery.  Subcutaneous was closed with interrupted 2-0  Vicryl and subcuticular running 4-0 Monocryl.  Incisions cleaned and dried and Steri-Strips and sterile dressing applied.  The patient was then awakened and transported to recovery in stable  condition.  Note that a surgical assistant was of medical necessity for this procedure to do this in a safe and expeditious manner.  Surgical assistant was necessary for retraction of vital ligaments and neurovascular structures and for proper positioning of the  limb for safe removal of the old implant and safe and accurate placement of the new implant   NIK D: 09/02/2022 4:43:05 pm T: 09/02/2022 11:15:00 pm  JOB: [id number]/ [id number]

## 2022-09-02 NOTE — Progress Notes (Signed)
Orthopedic Tech Progress Note Patient Details:  [name] October 15, 1940 [id number]  Patient ID: Katie Vasquez, female   DOB: [birth year], 81 y.o.   MRN: [id number]  Katie Vasquez 09/02/2022, 5:45 PM Cpm applied in pacu

## 2022-09-02 NOTE — Anesthesia Postprocedure Evaluation (Signed)
Anesthesia Post Note  Patient: Katie Vasquez  Procedure(s) Performed: Left total knee arthroplasty revision (Left: Knee)     Patient location during evaluation: PACU Anesthesia Type: General Level of consciousness: awake Pain management: pain level controlled Vital Signs Assessment: post-procedure vital signs reviewed and stable Respiratory status: spontaneous breathing Cardiovascular status: stable Postop Assessment: no apparent nausea or vomiting Anesthetic complications: no   No notable events documented.  Last Vitals:  Vitals:   09/02/22 1445 09/02/22 1700  BP: (!) 165/59 (!) 141/68  Pulse: 87 (!) 58  Resp: 18 17  Temp:    SpO2: 99% 99%    Last Pain:  Vitals:   09/02/22 1700  TempSrc:   PainSc: 0-No pain                 Kannan Proia

## 2022-09-02 NOTE — Anesthesia Procedure Notes (Signed)
Procedure Name: LMA Insertion Date/Time: 09/02/2022 3:10 PM  Performed by: Gean Maidens, CRNAPre-anesthesia Checklist: Patient identified, Emergency Drugs available, Suction available, Patient being monitored and Timeout performed Patient Re-evaluated:Patient Re-evaluated prior to induction Oxygen Delivery Method: Circle system utilized Preoxygenation: Pre-oxygenation with 100% oxygen Induction Type: IV induction Ventilation: Mask ventilation without difficulty LMA: LMA inserted LMA Size: 4.0 Number of attempts: 1 Placement Confirmation: positive ETCO2 and breath sounds checked- equal and bilateral Tube secured with: Tape Dental Injury: Teeth and Oropharynx as per pre-operative assessment

## 2022-09-02 NOTE — Progress Notes (Signed)
Orthopedic Tech Progress Note Patient Details:  Katie Vasquez 1941-09-12 [id number]  Patient ID: [name], female   DOB: 12/20/1940, 81 y.o.   MRN: [id number]  [name] 09/02/2022, 8:37 PM Cpm removed

## 2022-09-02 NOTE — Anesthesia Procedure Notes (Addendum)
Anesthesia Regional Block: Adductor canal block   Pre-Anesthetic Checklist: , timeout performed,  Correct Patient, Correct Site, Correct Laterality,  Correct Procedure, Correct Position, site marked,  Risks and benefits discussed,  Surgical consent,  Pre-op evaluation,  At surgeon's request and post-op pain management  Laterality: Left  Prep: chloraprep       Needles:  Injection technique: Single-shot  Needle Type: Echogenic Stimulator Needle          Additional Needles:   Procedures: Doppler guided,,,, ultrasound used (permanent image in chart),,    Narrative:  Start time: 09/02/2022 2:30 PM End time: 09/02/2022 2:45 PM Injection made incrementally with aspirations every 5 mL.  Performed by: Personally  Anesthesiologist: Belinda Block, MD

## 2022-09-03 LAB — CBC
HCT: 35.2 % — ABNORMAL LOW (ref 36.0–46.0)
Hemoglobin: 11.9 g/dL — ABNORMAL LOW (ref 12.0–15.0)
MCH: 28.8 pg (ref 26.0–34.0)
MCHC: 33.8 g/dL (ref 30.0–36.0)
MCV: 85.2 fL (ref 80.0–100.0)
Platelets: 407 10*3/uL — ABNORMAL HIGH (ref 150–400)
RBC: 4.13 MIL/uL (ref 3.87–5.11)
RDW: 12.6 % (ref 11.5–15.5)
WBC: 17.4 10*3/uL — ABNORMAL HIGH (ref 4.0–10.5)
nRBC: 0 % (ref 0.0–0.2)

## 2022-09-03 LAB — BASIC METABOLIC PANEL
Anion gap: 8 (ref 5–15)
BUN: 18 mg/dL (ref 8–23)
CO2: 26 mmol/L (ref 22–32)
Calcium: 8.5 mg/dL — ABNORMAL LOW (ref 8.9–10.3)
Chloride: 98 mmol/L (ref 98–111)
Creatinine, Ser: 0.85 mg/dL (ref 0.44–1.00)
GFR, Estimated: >60 mL/min (ref >60)
Glucose, Bld: 176 mg/dL — ABNORMAL HIGH (ref 70–99)
Potassium: 4.2 mmol/L (ref 3.5–5.1)
Sodium: 132 mmol/L — ABNORMAL LOW (ref 135–145)

## 2022-09-03 NOTE — Plan of Care (Signed)
  Problem: Activity: Goal: Ability to avoid complications of mobility impairment will improve Outcome: Progressing   Problem: Pain Management: Goal: Pain level will decrease with appropriate interventions Outcome: Progressing   

## 2022-09-03 NOTE — TOC Transition Note (Signed)
Transition of Care [facility]) - CM/SW Discharge Note   Patient Details  Name: Katie Vasquez MRN: [id number] Date of Birth: [birth year]  [facility] CM/SW Contact:  Lennart Pall, LCSW Phone Number: 09/03/2022, 10:22 AM   Clinical Narrative:     Met with pt today who reports need for RW and no DME agency preference.  Order placed with [employer] for delivery of item to room.  OPPT already arranged with [facility].  No further TOC needs.  Final next level of care: OP Rehab Barriers to Discharge: No Barriers Identified   Patient Goals and CMS Choice Patient states their goals for this hospitalization and ongoing recovery are:: return home      Discharge Placement                       Discharge Plan and Services                DME Arranged: Walker rolling DME Agency: [employer] Date DME Agency Contacted: 09/03/22 Time DME Agency Contacted: [phone] Representative spoke with at DME Agency: [place] ([place] Interventions     Readmission Risk Interventions    09/03/2022   10:21 AM  Readmission Risk Prevention Plan  Post Dischage Appt Complete  Medication Screening Complete  Transportation Screening Complete

## 2022-09-03 NOTE — Progress Notes (Signed)
   Subjective: 1 Day Post-Op Procedure(s) (LRB): Left total knee arthroplasty revision (Left) Patient seen in rounds by Dr. Wynelle Link. Patient is well, and has had no acute complaints or problems. Denies SOB or chest pain. Denies calf pain. Foley cath removed this AM. Patient reports pain as mild.  Objective: Vital signs in last 24 hours: Temp:  [97.6 F (36.4 C)-98 F (36.7 C)] 97.8 F (36.6 C) (11/30 0456) Pulse Rate:  [58-87] 77 (11/30 0456) Resp:  [15-20] 20 (11/30 0456) BP: (141-173)/(59-84) 161/79 (11/30 0456) SpO2:  [94 %-100 %] 96 % (11/30 0456) Weight:  [71 kg] 71 kg (11/29 1223)  Intake/Output from previous day:  Intake/Output Summary (Last 24 hours) at 09/03/2022 0721 Last data filed at 09/03/2022 0522 Gross per 24 hour  Intake 1993.59 ml  Output 1175 ml  Net 818.59 ml     Intake/Output this shift: No intake/output data recorded.  Labs: Recent Labs    08/31/22 1008 09/03/22 0323  HGB 13.6 11.9*   Recent Labs    08/31/22 1008 09/03/22 0323  WBC 8.5 17.4*  RBC 4.76 4.13  HCT 41.7 35.2*  PLT 467* 407*   Recent Labs    08/31/22 1008 09/03/22 0323  NA 138 132*  K 3.9 4.2  CL 103 98  CO2 25 26  BUN 20 18  CREATININE 0.94 0.85  GLUCOSE 86 176*  CALCIUM 9.3 8.5*   No results for input(s): "LABPT", "INR" in the last 72 hours.  Exam: General - Patient is Alert and Oriented Extremity - Neurologically intact Neurovascular intact Sensation intact distally Dorsiflexion/Plantar flexion intact Dressing - dressing C/D/I Motor Function - intact, moving foot and toes well on exam.  Past Medical History:  Diagnosis Date   Anemia    Back pain    Chronic insomnia    Depression    DJD (degenerative joint disease)    Dyspnea    Fibrocystic breast disease    Hepatitis A    1960's & 1980's   HTN (hypertension)    Retinal tear    TMJ (dislocation of temporomandibular joint)    Vertigo     Assessment/Plan: 1 Day Post-Op Procedure(s) (LRB): Left  total knee arthroplasty revision (Left) Principal Problem:   Failed total knee arthroplasty (HCC)  Estimated body mass index is 26.87 kg/m as calculated from the following:   Height as of this encounter: '5\' 4"'$  (1.626 m).   Weight as of this encounter: 71 kg. Advance diet Up with therapy D/C IV fluids  Anticipated LOS equal to or greater than 2 midnights due to - Age 42 and older with one or more of the following:  - Obesity  - Expected need for hospital services (PT, OT, Nursing) required for safe  discharge  - Anticipated need for postoperative skilled nursing care or inpatient rehab  - Active co-morbidities: None OR   - Unanticipated findings during/Post Surgery: None  - Patient is a high risk of re-admission due to: None   DVT Prophylaxis - Aspirin Weight bearing as tolerated.  Start with physical therapy today. Given extent of surgery, would benefit from additional night in hospital for pain control and maximizing mobility.  R. Jaynie Bream, PA-C Orthopedic Surgery [phone] 09/03/2022, 7:21 AM

## 2022-09-03 NOTE — Progress Notes (Signed)
Physical Therapy Treatment Patient Details Name: Katie Vasquez MRN: [id number] DOB: 09-Mar-1941 Today's Date: 09/03/2022   History of Present Illness Pt s/p L TKR revision and with hx of L TKR 23 years ago    PT Comments    Pt continues very cooperative and progressing steadily with mobility.  Pt up to ambulate increased distance in hall and with noted decreasing assist level for all mobility tasks.  Pt hopeful for dc home tomorrow.   Recommendations for follow up therapy are one component of a multi-disciplinary discharge planning process, led by the attending physician.  Recommendations may be updated based on patient status, additional functional criteria and insurance authorization.  Follow Up Recommendations  Follow physician's recommendations for discharge plan and follow up therapies     Assistance Recommended at Discharge Intermittent Supervision/Assistance  Patient can return home with the following A little help with walking and/or transfers;A little help with bathing/dressing/bathroom;Assistance with cooking/housework;Assist for transportation;Help with stairs or ramp for entrance   Equipment Recommendations  Rolling walker (2 wheels)    Recommendations for Other Services       Precautions / Restrictions Precautions Precautions: Knee;Fall Required Braces or Orthoses: Knee Immobilizer - Left Knee Immobilizer - Left: Discontinue once straight leg raise with < 10 degree lag Restrictions Weight Bearing Restrictions: No Other Position/Activity Restrictions: WBAT     Mobility  Bed Mobility Overal bed mobility: Needs Assistance Bed Mobility: Supine to Sit, Sit to Supine     Supine to sit: Min assist Sit to supine: Min assist   General bed mobility comments: min assist to manage L LE; cues for sequence and use of R LE to self assist    Transfers Overall transfer level: Needs assistance Equipment used: Rolling walker (2 wheels) Transfers: Sit to/from Stand Sit to  Stand: Min guard           General transfer comment: cues for LE management and use of UEs to self assist    Ambulation/Gait Ambulation/Gait assistance: Min guard Gait Distance (Feet): 110 Feet Assistive device: Rolling walker (2 wheels) Gait Pattern/deviations: Step-to pattern, Decreased step length - right, Decreased step length - left, Shuffle, Trunk flexed Gait velocity: decr     General Gait Details: min cues for sequence, posture, position from RW and stride length   Stairs             Wheelchair Mobility    Modified Rankin (Stroke Patients Only)       Balance Overall balance assessment: Needs assistance Sitting-balance support: No upper extremity supported Sitting balance-Leahy Scale: Fair     Standing balance support: Bilateral upper extremity supported Standing balance-Leahy Scale: Poor                              Cognition Arousal/Alertness: Awake/alert Behavior During Therapy: WFL for tasks assessed/performed Overall Cognitive Status: Within Functional Limits for tasks assessed                                          Exercises Total Joint Exercises Ankle Circles/Pumps: AROM, Both, 15 reps, Supine Quad Sets: AROM, Both, 10 reps, Supine Heel Slides: AAROM, Left, 15 reps, Supine Straight Leg Raises: AAROM, Left, 10 reps, Supine    General Comments        Pertinent Vitals/Pain Pain Assessment Pain Assessment: 0-10 Pain Score: 5  Pain  Location: L knee Pain Descriptors / Indicators: Aching, Sore Pain Intervention(s): Limited activity within patient's tolerance, Monitored during session, Premedicated before session, Ice applied    Home Living Family/patient expects to be discharged to:: Private residence Living Arrangements: Spouse/significant other Available Help at Discharge: Available 24 hours/day Type of Home: House Home Access: Stairs to enter Entrance Stairs-Rails: None Entrance Stairs-Number of  Steps: 2 Alternate Level Stairs-Number of Steps: flight Home Layout: Multi-level;Able to live on main level with bedroom/bathroom Home Equipment: BSC/3in1      Prior Function            PT Goals (current goals can now be found in the care plan section) Acute Rehab PT Goals Patient Stated Goal: Regain IND PT Goal Formulation: With patient Time For Goal Achievement: 09/08/22 Potential to Achieve Goals: Good Progress towards PT goals: Progressing toward goals    Frequency    7X/week      PT Plan Current plan remains appropriate    Co-evaluation              AM-PAC PT "6 Clicks" Mobility   Outcome Measure  Help needed turning from your back to your side while in a flat bed without using bedrails?: A Little Help needed moving from lying on your back to sitting on the side of a flat bed without using bedrails?: A Little Help needed moving to and from a bed to a chair (including a wheelchair)?: A Little Help needed standing up from a chair using your arms (e.g., wheelchair or bedside chair)?: A Little Help needed to walk in hospital room?: A Little Help needed climbing 3-5 steps with a railing? : A Lot 6 Click Score: 17    End of Session Equipment Utilized During Treatment: Gait belt Activity Tolerance: Patient tolerated treatment well Patient left: in bed;with bed alarm set;with family/visitor present Nurse Communication: Mobility status PT Visit Diagnosis: Difficulty in walking, not elsewhere classified (R26.2)     Time: 1610-9604 PT Time Calculation (min) (ACUTE ONLY): 30 min  Charges:  $Gait Training: 23-37 mins $Therapeutic Exercise: 8-22 mins                     Katie Vasquez PT Acute Rehabilitation Services Pager [phone] Office [phone]    Katie Vasquez 09/03/2022, 1:46 PM

## 2022-09-03 NOTE — Progress Notes (Signed)
Orthopedic Tech Progress Note Patient Details:  Katie Vasquez [birth year] [id number] Put on cpm Patient ID: Katie Vasquez, female   DOB: Dec 07, 1940, 81 y.o.   MRN: [id number]  Katie Vasquez 09/03/2022, 4:27 PM

## 2022-09-03 NOTE — Evaluation (Signed)
Physical Therapy Evaluation Patient Details Name: Katie Vasquez MRN: [id number] DOB: 11/23/1940 Today's Date: 09/03/2022  History of Present Illness  Pt s/p L TKR revision and with hx of L TKR 23 years ago  Clinical Impression  Pt admitted as above and presenting with functional mobility limitations 2* decreased L LE strength/ROM and post op pain.  Pt should progress well to dc home with family assist and reports first OP PT scheduled for 09/07/22.     Recommendations for follow up therapy are one component of a multi-disciplinary discharge planning process, led by the attending physician.  Recommendations may be updated based on patient status, additional functional criteria and insurance authorization.  Follow Up Recommendations Follow physician's recommendations for discharge plan and follow up therapies      Assistance Recommended at Discharge Intermittent Supervision/Assistance  Patient can return home with the following  A little help with walking and/or transfers;A little help with bathing/dressing/bathroom;Assistance with cooking/housework;Assist for transportation;Help with stairs or ramp for entrance    Equipment Recommendations Rolling walker (2 wheels) (already in room)  Recommendations for Other Services       Functional Status Assessment Patient has had a recent decline in their functional status and demonstrates the ability to make significant improvements in function in a reasonable and predictable amount of time.     Precautions / Restrictions Precautions Precautions: Knee;Fall Required Braces or Orthoses: Knee Immobilizer - Left Knee Immobilizer - Left: Discontinue once straight leg raise with < 10 degree lag Restrictions Weight Bearing Restrictions: No Other Position/Activity Restrictions: WBAT      Mobility  Bed Mobility Overal bed mobility: Needs Assistance Bed Mobility: Supine to Sit     Supine to sit: Min assist     General bed mobility comments:  min assist to manage L LE; cues for sequence and use of R LE to self assist    Transfers Overall transfer level: Needs assistance Equipment used: Rolling walker (2 wheels) Transfers: Sit to/from Stand Sit to Stand: Min assist           General transfer comment: cues for LE management and use of UEs to self assist    Ambulation/Gait Ambulation/Gait assistance: Min assist Gait Distance (Feet): 75 Feet Assistive device: Rolling walker (2 wheels) Gait Pattern/deviations: Step-to pattern, Decreased step length - right, Decreased step length - left, Shuffle, Trunk flexed Gait velocity: decr     General Gait Details: cues for sequence, posture, position from RW and stride length  Stairs            Wheelchair Mobility    Modified Rankin (Stroke Patients Only)       Balance Overall balance assessment: Needs assistance Sitting-balance support: No upper extremity supported Sitting balance-Leahy Scale: Fair     Standing balance support: Bilateral upper extremity supported Standing balance-Leahy Scale: Poor                               Pertinent Vitals/Pain Pain Assessment Pain Assessment: 0-10 Pain Score: 5  Pain Location: L knee Pain Descriptors / Indicators: Aching, Sore Pain Intervention(s): Limited activity within patient's tolerance, Monitored during session, Premedicated before session, Ice applied    Home Living Family/patient expects to be discharged to:: Private residence Living Arrangements: Spouse/significant other Available Help at Discharge: Available 24 hours/day Type of Home: House Home Access: Stairs to enter Entrance Stairs-Rails: None Entrance Stairs-Number of Steps: 2 Alternate Level Stairs-Number of Steps: flight Home Layout: Multi-level;Able  to live on main level with bedroom/bathroom Home Equipment: BSC/3in1      Prior Function Prior Level of Function : Independent/Modified Independent                     Hand  Dominance        Extremity/Trunk Assessment   Upper Extremity Assessment Upper Extremity Assessment: RUE deficits/detail RUE Deficits / Details: Ltd my rotator cuff issues    Lower Extremity Assessment Lower Extremity Assessment: LLE deficits/detail LLE Deficits / Details: 2+/5 quads with AAROM at knee -6 - 45       Communication   Communication: No difficulties  Cognition Arousal/Alertness: Awake/alert Behavior During Therapy: WFL for tasks assessed/performed Overall Cognitive Status: Within Functional Limits for tasks assessed                                          General Comments      Exercises Total Joint Exercises Ankle Circles/Pumps: AROM, Both, 15 reps, Supine Quad Sets: AROM, Both, 10 reps, Supine Heel Slides: AAROM, Left, 15 reps, Supine Straight Leg Raises: AAROM, Left, 10 reps, Supine   Assessment/Plan    PT Assessment Patient needs continued PT services  PT Problem List Decreased strength;Decreased range of motion;Decreased activity tolerance;Decreased balance;Decreased mobility;Decreased knowledge of use of DME;Pain       PT Treatment Interventions DME instruction;Gait training;Stair training;Functional mobility training;Therapeutic activities;Therapeutic exercise;Patient/family education    PT Goals (Current goals can be found in the Care Plan section)  Acute Rehab PT Goals Patient Stated Goal: Regain IND PT Goal Formulation: With patient Time For Goal Achievement: 09/08/22 Potential to Achieve Goals: Good    Frequency 7X/week     Co-evaluation               AM-PAC PT "6 Clicks" Mobility  Outcome Measure Help needed turning from your back to your side while in a flat bed without using bedrails?: A Little Help needed moving from lying on your back to sitting on the side of a flat bed without using bedrails?: A Little Help needed moving to and from a bed to a chair (including a wheelchair)?: A Little Help needed  standing up from a chair using your arms (e.g., wheelchair or bedside chair)?: A Little Help needed to walk in hospital room?: A Little Help needed climbing 3-5 steps with a railing? : A Lot 6 Click Score: 17    End of Session Equipment Utilized During Treatment: Gait belt Activity Tolerance: Patient tolerated treatment well Patient left: in chair;with call bell/phone within reach;with nursing/sitter in room Nurse Communication: Mobility status PT Visit Diagnosis: Difficulty in walking, not elsewhere classified (R26.2)    Time: 1010-1050 PT Time Calculation (min) (ACUTE ONLY): 40 min   Charges:   PT Evaluation $PT Eval Low Complexity: 1 Low PT Treatments $Gait Training: 8-22 mins $Therapeutic Exercise: 8-22 mins        Debe Coder PT Acute Rehabilitation Services Pager [phone] Office [phone]   Avett Reineck 09/03/2022, 11:10 AM

## 2022-09-04 ENCOUNTER — Encounter (HOSPITAL_COMMUNITY): Payer: Self-pay | Admitting: Orthopedic Surgery

## 2022-09-04 LAB — CBC
HCT: 35.4 % — ABNORMAL LOW (ref 36.0–46.0)
Hemoglobin: 11.6 g/dL — ABNORMAL LOW (ref 12.0–15.0)
MCH: 28.6 pg (ref 26.0–34.0)
MCHC: 32.8 g/dL (ref 30.0–36.0)
MCV: 87.2 fL (ref 80.0–100.0)
Platelets: 431 10*3/uL — ABNORMAL HIGH (ref 150–400)
RBC: 4.06 MIL/uL (ref 3.87–5.11)
RDW: 13.2 % (ref 11.5–15.5)
WBC: 13.7 10*3/uL — ABNORMAL HIGH (ref 4.0–10.5)
nRBC: 0 % (ref 0.0–0.2)

## 2022-09-04 MED ORDER — ASPIRIN 325 MG PO TBEC: 325.0000 mg | DELAYED_RELEASE_TABLET | Freq: Two times a day (BID) | ORAL | 0 refills | Status: AC

## 2022-09-04 MED ORDER — METHOCARBAMOL 500 MG PO TABS: 500.0000 mg | ORAL_TABLET | Freq: Four times a day (QID) | ORAL | 0 refills | Status: DC | PRN

## 2022-09-04 MED ORDER — OXYCODONE HCL 5 MG PO TABS: 5.0000 mg | ORAL_TABLET | Freq: Four times a day (QID) | ORAL | 0 refills | Status: DC | PRN

## 2022-09-04 MED ORDER — TRAMADOL HCL 50 MG PO TABS: 50.0000 mg | ORAL_TABLET | Freq: Four times a day (QID) | ORAL | 0 refills | Status: DC | PRN

## 2022-09-04 NOTE — Progress Notes (Signed)
Physical Therapy Treatment Patient Details Name: Katie Vasquez MRN: [id number] DOB: 04-06-1941 Today's Date: 09/04/2022   History of Present Illness Pt s/p L TKR revision and with hx of L TKR 23 years ago    PT Comments    Pt continues very cooperative but limited this pm by c/o fatigue, dizziness and nausea with OOB activity - BP 140/87.  RN aware.   Recommendations for follow up therapy are one component of a multi-disciplinary discharge planning process, led by the attending physician.  Recommendations may be updated based on patient status, additional functional criteria and insurance authorization.  Follow Up Recommendations  Follow physician's recommendations for discharge plan and follow up therapies     Assistance Recommended at Discharge Intermittent Supervision/Assistance  Patient can return home with the following A little help with walking and/or transfers;A little help with bathing/dressing/bathroom;Assistance with cooking/housework;Assist for transportation;Help with stairs or ramp for entrance   Equipment Recommendations  Rolling walker (2 wheels)    Recommendations for Other Services       Precautions / Restrictions Precautions Precautions: Knee;Fall Required Braces or Orthoses: Knee Immobilizer - Left Knee Immobilizer - Left: Discontinue once straight leg raise with < 10 degree lag Restrictions Weight Bearing Restrictions: No Other Position/Activity Restrictions: WBAT     Mobility  Bed Mobility Overal bed mobility: Needs Assistance Bed Mobility: Supine to Sit, Sit to Supine     Supine to sit: Min guard Sit to supine: Min assist   General bed mobility comments: Increased time with cues for sequence and use of R LE to self assist    Transfers Overall transfer level: Needs assistance Equipment used: Rolling walker (2 wheels) Transfers: Sit to/from Stand Sit to Stand: Min guard           General transfer comment: Steady assist with cues for LE  management and use of UEs to self assist    Ambulation/Gait Ambulation/Gait assistance: Min guard Gait Distance (Feet): 35 Feet Assistive device: Rolling walker (2 wheels) Gait Pattern/deviations: Step-to pattern, Decreased step length - right, Decreased step length - left, Shuffle, Trunk flexed Gait velocity: decr     General Gait Details: min cues for sequence, posture, position from RW and stride length   Stairs Stairs: Yes Stairs assistance: Min assist Stair Management: No rails, Step to pattern, Backwards, With walker Number of Stairs: 2 General stair comments: cues for sequence; pt unable to complete second attempt 2* c/o fatigue and dizziness   Wheelchair Mobility    Modified Rankin (Stroke Patients Only)       Balance Overall balance assessment: Needs assistance Sitting-balance support: No upper extremity supported Sitting balance-Leahy Scale: Fair     Standing balance support: Bilateral upper extremity supported Standing balance-Leahy Scale: Poor                              Cognition Arousal/Alertness: Awake/alert Behavior During Therapy: WFL for tasks assessed/performed Overall Cognitive Status: Within Functional Limits for tasks assessed                                          Exercises Total Joint Exercises Ankle Circles/Pumps: AROM, Both, 15 reps, Supine Quad Sets: AROM, Both, 10 reps, Supine Heel Slides: AAROM, Left, 15 reps, Supine Straight Leg Raises: AAROM, Left, Supine, 15 reps    General Comments  Pertinent Vitals/Pain Pain Assessment Pain Assessment: 0-10 Pain Score: 5  Pain Location: L knee Pain Descriptors / Indicators: Aching, Sore Pain Intervention(s): Limited activity within patient's tolerance, Monitored during session, Ice applied, Premedicated before session    Home Living                          Prior Function            PT Goals (current goals can now be found in the  care plan section) Acute Rehab PT Goals Patient Stated Goal: Regain IND PT Goal Formulation: With patient Time For Goal Achievement: 09/08/22 Potential to Achieve Goals: Good Progress towards PT goals: Progressing toward goals    Frequency    7X/week      PT Plan Current plan remains appropriate    Co-evaluation              AM-PAC PT "6 Clicks" Mobility   Outcome Measure  Help needed turning from your back to your side while in a flat bed without using bedrails?: A Little Help needed moving from lying on your back to sitting on the side of a flat bed without using bedrails?: A Little Help needed moving to and from a bed to a chair (including a wheelchair)?: A Little Help needed standing up from a chair using your arms (e.g., wheelchair or bedside chair)?: A Little Help needed to walk in hospital room?: A Little Help needed climbing 3-5 steps with a railing? : A Little 6 Click Score: 18    End of Session Equipment Utilized During Treatment: Gait belt Activity Tolerance: Patient limited by fatigue Patient left: in bed;with bed alarm set Nurse Communication: Mobility status PT Visit Diagnosis: Difficulty in walking, not elsewhere classified (R26.2)     Time: 1250-1311 PT Time Calculation (min) (ACUTE ONLY): 21 min  Charges:  $Gait Training: 8-22 mins $Therapeutic Exercise: 8-22 mins $Therapeutic Activity: 8-22 mins                     Debe Coder PT Acute Rehabilitation Services Pager [phone] Office [phone]    Katie Vasquez 09/04/2022, 2:09 PM

## 2022-09-04 NOTE — Progress Notes (Signed)
Physical Therapy Treatment Patient Details Name: Katie Vasquez MRN: [id number] DOB: April 29, 1941 Today's Date: 09/04/2022   History of Present Illness Pt s/p L TKR revision and with hx of L TKR 23 years ago    PT Comments    Pt performed therex program with assist and assisted to bedside sitting.  With onset of nausea, pt assisted back to bed and RN alerted.  Recommendations for follow up therapy are one component of a multi-disciplinary discharge planning process, led by the attending physician.  Recommendations may be updated based on patient status, additional functional criteria and insurance authorization.  Follow Up Recommendations  Follow physician's recommendations for discharge plan and follow up therapies     Assistance Recommended at Discharge Intermittent Supervision/Assistance  Patient can return home with the following A little help with walking and/or transfers;A little help with bathing/dressing/bathroom;Assistance with cooking/housework;Assist for transportation;Help with stairs or ramp for entrance   Equipment Recommendations  Rolling walker (2 wheels)    Recommendations for Other Services       Precautions / Restrictions Precautions Precautions: Knee;Fall Required Braces or Orthoses: Knee Immobilizer - Left Knee Immobilizer - Left: Discontinue once straight leg raise with < 10 degree lag Restrictions Weight Bearing Restrictions: No Other Position/Activity Restrictions: WBAT     Mobility  Bed Mobility Overal bed mobility: Needs Assistance Bed Mobility: Supine to Sit, Sit to Supine     Supine to sit: Min guard Sit to supine: Min guard   General bed mobility comments: Increased time with cues for sequence and use of R LE to self assist    Transfers                   General transfer comment: deferred with onset nausea    Ambulation/Gait                   Stairs             Wheelchair Mobility    Modified Rankin (Stroke  Patients Only)       Balance                                            Cognition Arousal/Alertness: Awake/alert Behavior During Therapy: WFL for tasks assessed/performed Overall Cognitive Status: Within Functional Limits for tasks assessed                                          Exercises Total Joint Exercises Ankle Circles/Pumps: AROM, Both, 15 reps, Supine Quad Sets: AROM, Both, 10 reps, Supine Heel Slides: AAROM, Left, 15 reps, Supine Straight Leg Raises: AAROM, Left, Supine, 15 reps    General Comments        Pertinent Vitals/Pain Pain Assessment Pain Assessment: 0-10 Pain Score: 5  Pain Location: L knee Pain Descriptors / Indicators: Aching, Sore Pain Intervention(s): Limited activity within patient's tolerance, Monitored during session, Premedicated before session, Ice applied    Home Living                          Prior Function            PT Goals (current goals can now be found in the care plan section) Acute Rehab PT Goals Patient Stated Goal:  Regain IND PT Goal Formulation: With patient Time For Goal Achievement: 09/08/22 Potential to Achieve Goals: Good Progress towards PT goals: Progressing toward goals    Frequency    7X/week      PT Plan Current plan remains appropriate    Co-evaluation              AM-PAC PT "6 Clicks" Mobility   Outcome Measure  Help needed turning from your back to your side while in a flat bed without using bedrails?: A Little Help needed moving from lying on your back to sitting on the side of a flat bed without using bedrails?: A Little Help needed moving to and from a bed to a chair (including a wheelchair)?: A Little Help needed standing up from a chair using your arms (e.g., wheelchair or bedside chair)?: A Little Help needed to walk in hospital room?: A Little Help needed climbing 3-5 steps with a railing? : A Lot 6 Click Score: 17    End of Session  Equipment Utilized During Treatment: Gait belt Activity Tolerance: Patient limited by fatigue Patient left: in bed;with bed alarm set Nurse Communication: Mobility status PT Visit Diagnosis: Difficulty in walking, not elsewhere classified (R26.2)     Time: 1023-1050 PT Time Calculation (min) (ACUTE ONLY): 27 min  Charges:  $Therapeutic Exercise: 8-22 mins $Therapeutic Activity: 8-22 mins                     Debe Coder PT Acute Rehabilitation Services Pager [phone] Office [phone]    Constant Mand[place] 09/04/2022, 12:36 PM

## 2022-09-04 NOTE — Progress Notes (Signed)
   Subjective: 2 Days Post-Op Procedure(s) (LRB): Left total knee arthroplasty revision (Left) Patient reports pain as mild.   Patient seen in rounds for Dr. Wynelle Link. Patient is doing well, only reports mild thigh soreness from the tourniquet. Denies chest pain or SOB. No issues overnight.  Plan is to go Home after hospital stay.  Objective: Vital signs in last 24 hours: Temp:  [97.5 F (36.4 C)-98.1 F (36.7 C)] 97.7 F (36.5 C) (12/01 0429) Pulse Rate:  [75-88] 80 (12/01 0429) Resp:  [16-20] 16 (12/01 0429) BP: (94-167)/(55-108) 155/69 (12/01 0429) SpO2:  [93 %-98 %] 94 % (12/01 0429)  Intake/Output from previous day:  Intake/Output Summary (Last 24 hours) at 09/04/2022 0735 Last data filed at 09/03/2022 1900 Gross per 24 hour  Intake 1430.91 ml  Output 1800 ml  Net -369.09 ml    Intake/Output this shift: No intake/output data recorded.  Labs: Recent Labs    09/03/22 0323 09/04/22 0347  HGB 11.9* 11.6*   Recent Labs    09/03/22 0323 09/04/22 0347  WBC 17.4* 13.7*  RBC 4.13 4.06  HCT 35.2* 35.4*  PLT 407* 431*   Recent Labs    09/03/22 0323  NA 132*  K 4.2  CL 98  CO2 26  BUN 18  CREATININE 0.85  GLUCOSE 176*  CALCIUM 8.5*   No results for input(s): "LABPT", "INR" in the last 72 hours.  Exam: General - Patient is Alert and Oriented Extremity - Neurologically intact Neurovascular intact Sensation intact distally Dorsiflexion/Plantar flexion intact Dressing/Incision - clean, dry, no drainage. Bulky dressing removed, aquacel intact. Motor Function - intact, moving foot and toes well on exam.   Past Medical History:  Diagnosis Date   Anemia    Back pain    Chronic insomnia    Depression    DJD (degenerative joint disease)    Dyspnea    Fibrocystic breast disease    Hepatitis A    1960's & 1980's   HTN (hypertension)    Retinal tear    TMJ (dislocation of temporomandibular joint)    Vertigo     Assessment/Plan: 2 Days Post-Op  Procedure(s) (LRB): Left total knee arthroplasty revision (Left) Principal Problem:   Failed total knee arthroplasty ([place])  Estimated body mass index is 26.87 kg/m as calculated from the following:   Height as of this encounter: '5\' 4"'$  (1.626 m).   Weight as of this encounter: 71 kg. Up with therapy  DVT Prophylaxis - Aspirin Weight-bearing as tolerated  Foley catheter still in place this AM, spoke with RN and will be removed first thing this morning.  Will discharge after one session of therapy this morning if meeting goals. OPPT scheduled for Monday, follow-up in the office in 2 weeks.   The PDMP database was reviewed today prior to any opioid medications being prescribed to this patient.   Theresa Duty, PA-C Orthopedic Surgery [phone] 09/04/2022, 7:35 AM

## 2022-09-05 DIAGNOSIS — M1712 Unilateral primary osteoarthritis, left knee: Secondary | ICD-10-CM | POA: Diagnosis not present

## 2022-09-05 NOTE — Progress Notes (Signed)
Physical Therapy Treatment Patient Details Name: Katie Vasquez MRN: [id number] DOB: 02-22-1941 Today's Date: 09/05/2022   History of Present Illness Pt s/p L TKR revision and with hx of L TKR 23 years ago    PT Comments    Pt feeling better this date and eager for return home.  Pt performed HEP, ambulated in hall, reviewed KI and negotiated stairs.  Spouse present and participating with session.   Recommendations for follow up therapy are one component of a multi-disciplinary discharge planning process, led by the attending physician.  Recommendations may be updated based on patient status, additional functional criteria and insurance authorization.  Follow Up Recommendations  Follow physician's recommendations for discharge plan and follow up therapies     Assistance Recommended at Discharge Intermittent Supervision/Assistance  Patient can return home with the following A little help with walking and/or transfers;A little help with bathing/dressing/bathroom;Assistance with cooking/housework;Assist for transportation;Help with stairs or ramp for entrance   Equipment Recommendations  Rolling walker (2 wheels)    Recommendations for Other Services       Precautions / Restrictions Precautions Precautions: Knee;Fall Required Braces or Orthoses: Knee Immobilizer - Left Knee Immobilizer - Left: Discontinue once straight leg raise with < 10 degree lag Restrictions Weight Bearing Restrictions: No Other Position/Activity Restrictions: WBAT     Mobility  Bed Mobility Overal bed mobility: Needs Assistance Bed Mobility: Supine to Sit, Sit to Supine     Supine to sit: Min guard Sit to supine: Min guard   General bed mobility comments: Increased time with cues for sequence and use of R LE to self assist    Transfers Overall transfer level: Needs assistance Equipment used: Rolling walker (2 wheels) Transfers: Sit to/from Stand Sit to Stand: Supervision           General  transfer comment: Pt self cues for LE management and use of UEs to self assist    Ambulation/Gait Ambulation/Gait assistance: Min guard Gait Distance (Feet): 50 Feet Assistive device: Rolling walker (2 wheels) Gait Pattern/deviations: Step-to pattern, Decreased step length - right, Decreased step length - left, Shuffle, Trunk flexed Gait velocity: decr     General Gait Details: min cues for sequence, posture, position from RW and stride length   Stairs Stairs: Yes Stairs assistance: Min assist Stair Management: No rails, Step to pattern, Backwards, With walker Number of Stairs: 4 General stair comments: 2 steps twice bkwd with RW with cues for sequence.  Spouse assisting on 2nd attempt   Wheelchair Mobility    Modified Rankin (Stroke Patients Only)       Balance Overall balance assessment: Needs assistance Sitting-balance support: No upper extremity supported Sitting balance-Leahy Scale: Fair     Standing balance support: Single extremity supported Standing balance-Leahy Scale: Fair                              Cognition Arousal/Alertness: Awake/alert Behavior During Therapy: WFL for tasks assessed/performed Overall Cognitive Status: Within Functional Limits for tasks assessed                                          Exercises Total Joint Exercises Ankle Circles/Pumps: AROM, Both, 15 reps, Supine Quad Sets: AROM, Both, 10 reps, Supine Heel Slides: AAROM, Left, 15 reps, Supine Straight Leg Raises: AAROM, Left, Supine, 15 reps    General  Comments        Pertinent Vitals/Pain Pain Assessment Pain Assessment: 0-10 Pain Score: 4  Pain Location: L knee Pain Descriptors / Indicators: Aching, Sore Pain Intervention(s): Limited activity within patient's tolerance, Monitored during session    Home Living                          Prior Function            PT Goals (current goals can now be found in the care plan  section) Acute Rehab PT Goals Patient Stated Goal: Regain IND PT Goal Formulation: With patient Time For Goal Achievement: 09/08/22 Potential to Achieve Goals: Good Progress towards PT goals: Progressing toward goals    Frequency    7X/week      PT Plan Current plan remains appropriate    Co-evaluation              AM-PAC PT "6 Clicks" Mobility   Outcome Measure  Help needed turning from your back to your side while in a flat bed without using bedrails?: A Little Help needed moving from lying on your back to sitting on the side of a flat bed without using bedrails?: A Little Help needed moving to and from a bed to a chair (including a wheelchair)?: A Little Help needed standing up from a chair using your arms (e.g., wheelchair or bedside chair)?: A Little Help needed to walk in hospital room?: A Little Help needed climbing 3-5 steps with a railing? : A Little 6 Click Score: 18    End of Session Equipment Utilized During Treatment: Gait belt Activity Tolerance: Patient limited by fatigue Patient left: in bed;with bed alarm set Nurse Communication: Mobility status PT Visit Diagnosis: Difficulty in walking, not elsewhere classified (R26.2)     Time: 1210-1240 PT Time Calculation (min) (ACUTE ONLY): 30 min  Charges:  $Gait Training: 8-22 mins $Therapeutic Exercise: 8-22 mins                     Debe Coder PT Acute Rehabilitation Services Pager [phone] Office [phone]    Aedin Jeansonne 09/05/2022, 1:18 PM

## 2022-09-05 NOTE — Plan of Care (Signed)

## 2022-09-05 NOTE — Discharge Summary (Signed)
In most cases prophylactic antibiotics for Dental procdeures after total joint surgery are not necessary.  Exceptions are as follows:  1. History of prior total joint infection  2. Severely immunocompromised (Organ Transplant, cancer chemotherapy, Rheumatoid biologic meds such as [place])  3. Poorly controlled diabetes (A1C &gt; 8.0, blood glucose over 200)  If you have one of these conditions, contact your surgeon for an antibiotic prescription, prior to your dental procedure. Orthopedic Discharge Summary        Physician Discharge Summary  Patient ID: Katie Vasquez MRN: [id number] DOB/AGE: 04/30/1941 81 y.o.  Admit date: 09/02/2022 Discharge date: 09/05/2022   Procedures:  Procedure(s) (LRB): Left total knee arthroplasty revision (Left)  Attending Physician:  Dr. Wynelle Link  Admission Diagnoses:   failed left total knee  Discharge Diagnoses:  failed left total knee   Past Medical History:  Diagnosis Date   Anemia    Back pain    Chronic insomnia    Depression    DJD (degenerative joint disease)    Dyspnea    Fibrocystic breast disease    Hepatitis A    1960's & 1980's   HTN (hypertension)    Retinal tear    TMJ (dislocation of temporomandibular joint)    Vertigo     PCP: Charlane Ferretti, MD   Discharged Condition: good  Hospital Course:  Patient underwent the above stated procedure on 09/02/2022. Patient tolerated the procedure well and brought to the recovery room in good condition and subsequently to the floor. Patient had an uncomplicated hospital course and was stable for discharge.   Disposition: Discharge disposition: 01-Home or Self Care      with follow up in 2 weeks    Follow-up Information     Gaynelle Arabian, MD. Schedule an appointment as soon as possible for a visit in 2 week(s).   Specialty: Orthopedic Surgery Contact information: [address] [place] [address] [phone]                 Dental  Antibiotics:  In most cases prophylactic antibiotics for Dental procdeures after total joint surgery are not necessary.  Exceptions are as follows:  1. History of prior total joint infection  2. Severely immunocompromised (Organ Transplant, cancer chemotherapy, Rheumatoid biologic meds such as [place])  3. Poorly controlled diabetes (A1C &gt; 8.0, blood glucose over 200)  If you have one of these conditions, contact your surgeon for an antibiotic prescription, prior to your dental procedure.  Discharge Instructions     Call MD / Call 911   Complete by: As directed    If you experience chest pain or shortness of breath, CALL 911 and be transported to the hospital emergency room.  If you develope a fever above 101 F, pus (white drainage) or increased drainage or redness at the wound, or calf pain, call your surgeon's office.   Call MD / Call 911   Complete by: As directed    If you experience chest pain or shortness of breath, CALL 911 and be transported to the hospital emergency room.  If you develope a fever above 101 F, pus (white drainage) or increased drainage or redness at the wound, or calf pain, call your surgeon's office.   Change dressing   Complete by: As directed    You may remove the bulky bandage (ACE wrap and gauze) two days after surgery. You will have an adhesive waterproof bandage underneath. Leave this in place until your first follow-up appointment.  Constipation Prevention   Complete by: As directed    Drink plenty of fluids.  Prune juice may be helpful.  You may use a stool softener, such as Colace (over the counter) 100 mg twice a day.  Use MiraLax (over the counter) for constipation as needed.   Constipation Prevention   Complete by: As directed    Drink plenty of fluids.  Prune juice may be helpful.  You may use a stool softener, such as Colace (over the counter) 100 mg twice a day.  Use MiraLax (over the counter) for constipation as needed.   Diet - low sodium  heart healthy   Complete by: As directed    Diet - low sodium heart healthy   Complete by: As directed    Do not put a pillow under the knee. Place it under the heel.   Complete by: As directed    Driving restrictions   Complete by: As directed    No driving for two weeks   Increase activity slowly as tolerated   Complete by: As directed    Post-operative opioid taper instructions:   Complete by: As directed    POST-OPERATIVE OPIOID TAPER INSTRUCTIONS: It is important to wean off of your opioid medication as soon as possible. If you do not need pain medication after your surgery it is ok to stop day one. Opioids include: Codeine, Hydrocodone(Norco, Vicodin), Oxycodone(Percocet, oxycontin) and hydromorphone amongst others.  Long term and even short term use of opiods can cause: Increased pain response Dependence Constipation Depression Respiratory depression And more.  Withdrawal symptoms can include Flu like symptoms Nausea, vomiting And more Techniques to manage these symptoms Hydrate well Eat regular healthy meals Stay active Use relaxation techniques(deep breathing, meditating, yoga) Do Not substitute Alcohol to help with tapering If you have been on opioids for less than two weeks and do not have pain than it is ok to stop all together.  Plan to wean off of opioids This plan should start within one week post op of your joint replacement. Maintain the same interval or time between taking each dose and first decrease the dose.  Cut the total daily intake of opioids by one tablet each day Next start to increase the time between doses. The last dose that should be eliminated is the evening dose.      Post-operative opioid taper instructions:   Complete by: As directed    POST-OPERATIVE OPIOID TAPER INSTRUCTIONS: It is important to wean off of your opioid medication as soon as possible. If you do not need pain medication after your surgery it is ok to stop day  one. Opioids include: Codeine, Hydrocodone(Norco, Vicodin), Oxycodone(Percocet, oxycontin) and hydromorphone amongst others.  Long term and even short term use of opiods can cause: Increased pain response Dependence Constipation Depression Respiratory depression And more.  Withdrawal symptoms can include Flu like symptoms Nausea, vomiting And more Techniques to manage these symptoms Hydrate well Eat regular healthy meals Stay active Use relaxation techniques(deep breathing, meditating, yoga) Do Not substitute Alcohol to help with tapering If you have been on opioids for less than two weeks and do not have pain than it is ok to stop all together.  Plan to wean off of opioids This plan should start within one week post op of your joint replacement. Maintain the same interval or time between taking each dose and first decrease the dose.  Cut the total daily intake of opioids by one tablet each day Next start to increase  the time between doses. The last dose that should be eliminated is the evening dose.      TED hose   Complete by: As directed    Use stockings (TED hose) for three weeks on both leg(s).  You may remove them at night for sleeping.   Weight bearing as tolerated   Complete by: As directed        Allergies as of 09/05/2022       Reactions   Other    Mycins= GI upset   Prednisone Diarrhea   GI upset   Keflex [cephalexin] Rash        Medication List     TAKE these medications    amLODipine 5 MG tablet Commonly known as: NORVASC Take 5 mg by mouth at bedtime.   aspirin EC 325 MG tablet Take 1 tablet (325 mg total) by mouth 2 (two) times daily for 19 days. Then take one 81 mg aspirin once a day for three weeks. Then discontinue aspirin.   chlorthalidone 25 MG tablet Commonly known as: HYGROTON Take 25 mg by mouth every morning.   cyanocobalamin 1000 MCG tablet Commonly known as: VITAMIN B12 Take 1,000 mcg by mouth daily.   Fish Oil 1200 MG  Caps Take 1,200 mg by mouth daily.   methocarbamol 500 MG tablet Commonly known as: ROBAXIN Take 1 tablet (500 mg total) by mouth every 6 (six) hours as needed for muscle spasms.   multivitamin with minerals tablet Take 1 tablet by mouth daily. Spectra Vite   OVER THE COUNTER MEDICATION Take 1 capsule by mouth at bedtime. Mega Red  Joint Comfort   oxyCODONE 5 MG immediate release tablet Commonly known as: Oxy IR/ROXICODONE Take 1-2 tablets (5-10 mg total) by mouth every 6 (six) hours as needed for severe pain.   potassium chloride 10 MEQ CR capsule Commonly known as: MICRO-K Take 10 mEq by mouth at bedtime.   spironolactone 25 MG tablet Commonly known as: ALDACTONE Take 25 mg by mouth every morning.   traMADol 50 MG tablet Commonly known as: ULTRAM Take 1-2 tablets (50-100 mg total) by mouth every 6 (six) hours as needed for moderate pain.               Discharge Care Instructions  (From admission, onward)           Start     Ordered   09/04/22 0000  Weight bearing as tolerated        09/04/22 0738   09/04/22 0000  Change dressing       Comments: You may remove the bulky bandage (ACE wrap and gauze) two days after surgery. You will have an adhesive waterproof bandage underneath. Leave this in place until your first follow-up appointment.   09/04/22 5956              Signed: Ventura Bruns 09/05/2022, 7:11 AM  [facility] is now [facility] [address] [place], [place], [place] [address] Phone: [place]

## 2022-09-05 NOTE — Progress Notes (Signed)
   Subjective: 3 Days Post-Op Procedure(s) (LRB): Left total knee arthroplasty revision (Left)  Pt doing well with therapy Pain is moderate but tolerable Denies any new symptoms or issues overnight Patient reports pain as moderate.  Objective:   VITALS:   Vitals:   09/04/22 2150 09/05/22 0521  BP: (!) 175/75 (!) 169/72  Pulse: 95 98  Resp: 19 19  Temp: 97.8 F (36.6 C) 98.3 F (36.8 C)  SpO2: 95% 94%    Left knee incision healing well Nv intact distally Old drainage in the dressing that is mild No rashes or edema distally  Good quad activation  LABS Recent Labs    09/03/22 0323 09/04/22 0347  HGB 11.9* 11.6*  HCT 35.2* 35.4*  WBC 17.4* 13.7*  PLT 407* 431*    Recent Labs    09/03/22 0323  NA 132*  K 4.2  BUN 18  CREATININE 0.85  GLUCOSE 176*     Assessment/Plan: 3 Days Post-Op Procedure(s) (LRB): Left total knee arthroplasty revision (Left) Plan to d/c home today after therapy F/u in the office in 2 weeks Pain management as needed     Kathrynn Speed, [place] is now [facility] [address], [place], [place], [place] [address] Phone: [phone] [email or web address] Facebook  [employer]

## 2022-09-05 NOTE — Plan of Care (Signed)
  Problem: Activity: Goal: Ability to avoid complications of mobility impairment will improve Outcome: Progressing   Problem: Pain Management: Goal: Pain level will decrease with appropriate interventions Outcome: Progressing   

## 2022-09-05 NOTE — Progress Notes (Signed)
Pt alert and oriented at this time. Surgical dressing clean, dry and intact. No questions regarding discharge instructions. Belongings sent home with pt.

## 2022-09-07 DIAGNOSIS — M25662 Stiffness of left knee, not elsewhere classified: Secondary | ICD-10-CM | POA: Diagnosis not present

## 2022-09-09 DIAGNOSIS — M25662 Stiffness of left knee, not elsewhere classified: Secondary | ICD-10-CM | POA: Diagnosis not present

## 2022-09-11 DIAGNOSIS — M25662 Stiffness of left knee, not elsewhere classified: Secondary | ICD-10-CM | POA: Diagnosis not present

## 2022-09-16 DIAGNOSIS — M25662 Stiffness of left knee, not elsewhere classified: Secondary | ICD-10-CM | POA: Diagnosis not present

## 2022-09-18 DIAGNOSIS — M25662 Stiffness of left knee, not elsewhere classified: Secondary | ICD-10-CM | POA: Diagnosis not present

## 2022-09-22 DIAGNOSIS — M25662 Stiffness of left knee, not elsewhere classified: Secondary | ICD-10-CM | POA: Diagnosis not present

## 2022-09-25 DIAGNOSIS — M25662 Stiffness of left knee, not elsewhere classified: Secondary | ICD-10-CM | POA: Diagnosis not present

## 2022-09-30 ENCOUNTER — Telehealth: Payer: Self-pay | Admitting: *Deleted

## 2022-09-30 DIAGNOSIS — M25662 Stiffness of left knee, not elsewhere classified: Secondary | ICD-10-CM | POA: Diagnosis not present

## 2022-09-30 NOTE — Telephone Encounter (Signed)
Patient came into office requesting appointment.   Reports seeing Dr. Sallyanne Kuster prior to her recent surgery and requested to not change medication for her CHF.   She reports having surgery 12/2 on her left knee.  Reports doing PT and making great progress but reports significant fatigue.  She states "If I was an 8v I would be running at about a 2".    She reports SOB but able to ambulate without difficulty.   Reports being on ASA after surgery.    Patient in no distress at current time, able to ambulate, speak in complete sentences.  Advised if SOB is significantly different or increased since surgery would recommend ED evaluation (due to recent surgery).     Patient declines and request 1st available appt.   Appt scheduled tomorrow with DOD, Dr. Ellyn Hack at 330 pm.  Patient aware.   Patient aware if symptoms change or progress to proceed to ED for evaluation.

## 2022-10-01 ENCOUNTER — Encounter: Payer: Self-pay | Admitting: Cardiology

## 2022-10-01 ENCOUNTER — Ambulatory Visit: Payer: Medicare HMO | Attending: Cardiology | Admitting: Cardiology

## 2022-10-01 VITALS — BP 158/74 | HR 78 | Wt 129.8 lb

## 2022-10-01 DIAGNOSIS — I5042 Chronic combined systolic (congestive) and diastolic (congestive) heart failure: Secondary | ICD-10-CM | POA: Diagnosis not present

## 2022-10-01 DIAGNOSIS — Z961 Presence of intraocular lens: Secondary | ICD-10-CM | POA: Diagnosis not present

## 2022-10-01 DIAGNOSIS — H04123 Dry eye syndrome of bilateral lacrimal glands: Secondary | ICD-10-CM | POA: Diagnosis not present

## 2022-10-01 DIAGNOSIS — H35373 Puckering of macula, bilateral: Secondary | ICD-10-CM | POA: Diagnosis not present

## 2022-10-01 DIAGNOSIS — I447 Left bundle-branch block, unspecified: Secondary | ICD-10-CM | POA: Insufficient documentation

## 2022-10-01 DIAGNOSIS — I5032 Chronic diastolic (congestive) heart failure: Secondary | ICD-10-CM | POA: Insufficient documentation

## 2022-10-01 MED ORDER — LOSARTAN POTASSIUM 25 MG PO TABS: 25.0000 mg | ORAL_TABLET | Freq: Every day | ORAL | 11 refills | Status: DC

## 2022-10-01 NOTE — Patient Instructions (Addendum)
Medication Instructions:   Losartan 25 mg  daily  *If you need a refill on your cardiac medications before your next appointment, please call your pharmacy*   Lab Work: Not needed .   Testing/Procedures:  Will be schedule at [facility] has requested that you have an echocardiogram. Echocardiography is a painless test that uses sound waves to create images of your heart. It provides your doctor with information about the size and shape of your heart and how well your heart's chambers and valves are working. This procedure takes approximately one hour. There are no restrictions for this procedure. Please do NOT wear cologne, perfume, aftershave, or lotions (deodorant is allowed). Please arrive 15 minutes prior to your appointment time.   Follow-Up: At [facility], you and your health needs are our priority.  As part of our continuing mission to provide you with exceptional heart care, we have created designated Provider Care Teams.  These Care Teams include your primary Cardiologist (physician) and Advanced Practice Providers (APPs -  Physician Assistants and Nurse Practitioners) who all work together to provide you with the care you need, when you need it.     Your next appointment:   1 to 2 month(s)  The format for your next appointment:   In Person  Provider:   Dr Sallyanne Kuster  ( patient request)
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
## 2022-10-02 ENCOUNTER — Encounter: Payer: Self-pay | Admitting: Cardiology

## 2022-10-02 ENCOUNTER — Telehealth: Payer: Self-pay | Admitting: *Deleted

## 2022-10-02 NOTE — Telephone Encounter (Signed)
RN received message from scheduler patient  wanted ask a question  RN called patient.  Patient stated she was taking 4 medication at present time and was the new medication Losartan a substitute for one of the medication or an addition to her current medications.  And when shiuld she take the medications?  RN informed patient the Losartan is an addition to her medications . Patient states she takes Amlodipine and potassium in the evening. RN suggested since she take chlorthalidone and spironolactone in the morning - start taking Losartan and Amlodipine , potassium  at the same time.  Patient voiced understanding.

## 2022-10-02 NOTE — Assessment & Plan Note (Signed)
As noted, would be a classic candidate for CRT-D/CRT-P.  She may better understand that it if she is looking for quality of life, CRT-P would be a beneficial option.

## 2022-10-02 NOTE — Assessment & Plan Note (Signed)
Now symptomatic with her reduced EF.  I do not know when her symptoms started getting worse, but it may be that she now is able to do more with her knee not bothering her and she is noting exertional dyspnea.  No chest pain that would be anginal in nature.  She has off-and-on pains across her chest in different locations at rest but not exertional. She is quick to say that is more fatigued and exertional dyspnea.  As such, would not want to use beta-blocker. I reiterated Dr. Sallyanne Kuster was suggestion of CRT-D/CRT-P.  At this point, she may be interested in medications but not devices.  Will add afterload reduction and potentially switch from amlodipine to losartan. => This will allow [place] to potentially consider Entresto in the future.  She has tolerated lisinopril in the past.  Not sure when amlodipine was substituted.  Plan: Continue current meds but add losartan 25 mg in the evening.  Change amlodipine to evening and put both diuretics in the morning. Check 2D echo to reassess EF.  Schedule follow-up with Dr. Sallyanne Kuster or APP in 1 to 2 months.

## 2022-10-06 DIAGNOSIS — Z5189 Encounter for other specified aftercare: Secondary | ICD-10-CM | POA: Diagnosis not present

## 2022-10-08 DIAGNOSIS — M25662 Stiffness of left knee, not elsewhere classified: Secondary | ICD-10-CM | POA: Diagnosis not present

## 2022-10-12 DIAGNOSIS — M25662 Stiffness of left knee, not elsewhere classified: Secondary | ICD-10-CM | POA: Diagnosis not present

## 2022-10-14 DIAGNOSIS — M25662 Stiffness of left knee, not elsewhere classified: Secondary | ICD-10-CM | POA: Diagnosis not present

## 2022-10-21 DIAGNOSIS — M25662 Stiffness of left knee, not elsewhere classified: Secondary | ICD-10-CM | POA: Diagnosis not present

## 2022-10-23 DIAGNOSIS — M25662 Stiffness of left knee, not elsewhere classified: Secondary | ICD-10-CM | POA: Diagnosis not present

## 2022-10-28 ENCOUNTER — Ambulatory Visit (HOSPITAL_COMMUNITY): Payer: Medicare HMO | Attending: Cardiology

## 2022-10-28 DIAGNOSIS — I5042 Chronic combined systolic (congestive) and diastolic (congestive) heart failure: Secondary | ICD-10-CM | POA: Diagnosis not present

## 2022-10-28 DIAGNOSIS — I447 Left bundle-branch block, unspecified: Secondary | ICD-10-CM | POA: Diagnosis not present

## 2022-10-29 ENCOUNTER — Telehealth: Payer: Self-pay | Admitting: *Deleted

## 2022-10-29 DIAGNOSIS — R5383 Other fatigue: Secondary | ICD-10-CM

## 2022-10-29 LAB — ECHOCARDIOGRAM COMPLETE
Area-P 1/2: 2.63 cm2
S' Lateral: 2.4 cm

## 2022-10-29 NOTE — Progress Notes (Signed)
No clear explanation for fatigue from a cardiac point of view.  Her hemoglobin was slowly drifting down in the last few months of last year.  Would recommend rechecking CBC to make sure she is not more anemic and would also check thyroid studies (TSH).

## 2022-10-29 NOTE — Telephone Encounter (Signed)
-----  Message from Sanda Klein, MD sent at 10/29/2022 11:02 AM EST ----- No good explanation for her fatigue from a cardiac point of view.  Her hemoglobin was drifting down in the last few months of last year.  Please recheck a CBC to make sure she is not becoming more anemic and check TSH as well.

## 2022-10-29 NOTE — Telephone Encounter (Signed)
Spoke with pt, aware of dr croitoru's recommendations. Lab orders mailed to the pt

## 2022-11-17 ENCOUNTER — Telehealth: Payer: Self-pay | Admitting: Gastroenterology

## 2022-11-17 NOTE — Telephone Encounter (Signed)
Dr. Tarri Glenn,  We received a referral from PCP for chronic diarrhea.  Patient last saw [facility] in 06/2022 and stated she was not satisfied with the care she received.  She is requesting to switch to [facility] per her PCP recommendations.  Please advise scheduling?  Thanks  Supervising MD 2/13/24am

## 2022-11-19 DIAGNOSIS — R5383 Other fatigue: Secondary | ICD-10-CM | POA: Diagnosis not present

## 2022-11-20 ENCOUNTER — Encounter: Payer: Self-pay | Admitting: *Deleted

## 2022-11-20 LAB — CBC
Hematocrit: 40.3 % (ref 34.0–46.6)
Hemoglobin: 13 g/dL (ref 11.1–15.9)
MCH: 27.3 pg (ref 26.6–33.0)
MCHC: 32.3 g/dL (ref 31.5–35.7)
MCV: 85 fL (ref 79–97)
Platelets: 506 10*3/uL — ABNORMAL HIGH (ref 150–450)
RBC: 4.76 x10E6/uL (ref 3.77–5.28)
RDW: 12.9 % (ref 11.7–15.4)
WBC: 8.4 10*3/uL (ref 3.4–10.8)

## 2022-11-20 LAB — TSH: TSH: 2.1 u[IU]/mL (ref 0.450–4.500)

## 2022-12-02 ENCOUNTER — Encounter: Payer: Self-pay | Admitting: Cardiovascular Disease

## 2022-12-02 ENCOUNTER — Ambulatory Visit: Payer: Medicare HMO | Attending: Cardiovascular Disease | Admitting: Cardiovascular Disease

## 2022-12-02 VITALS — BP 160/101 | HR 79 | Ht 64.0 in | Wt 129.0 lb

## 2022-12-02 DIAGNOSIS — Z79899 Other long term (current) drug therapy: Secondary | ICD-10-CM | POA: Diagnosis not present

## 2022-12-02 DIAGNOSIS — I447 Left bundle-branch block, unspecified: Secondary | ICD-10-CM

## 2022-12-02 DIAGNOSIS — I10 Essential (primary) hypertension: Secondary | ICD-10-CM

## 2022-12-02 DIAGNOSIS — I5042 Chronic combined systolic (congestive) and diastolic (congestive) heart failure: Secondary | ICD-10-CM | POA: Diagnosis not present

## 2022-12-02 MED ORDER — SACUBITRIL-VALSARTAN 49-51 MG PO TABS: 1.0000 | ORAL_TABLET | Freq: Two times a day (BID) | ORAL | 6 refills | Status: DC

## 2022-12-02 NOTE — Telephone Encounter (Signed)
Good morning Dr. Tarri Glenn,   We received the additional records from [facility], you requested. They are now being sent up for your review.   Thank you.

## 2022-12-02 NOTE — Patient Instructions (Signed)
Medication Instructions:   Entresto 49/51 mg  twice a day   Stop taking Losartan   *If you need a refill on your cardiac medications before your next appointment, please call your pharmacy*   Lab Work: Not needed    Testing/Procedures:    Follow-Up: At [facility], you and your health needs are our priority.  As part of our continuing mission to provide you with exceptional heart care, we have created designated Provider Care Teams.  These Care Teams include your primary Cardiologist (physician) and Advanced Practice Providers (APPs -  Physician Assistants and Nurse Practitioners) who all work together to provide you with the care you need, when you need it.     Your next appointment:   3 month(s)  The format for your next appointment:   In Person  Provider:   Sanda Klein, MD   You have been referred to [facility]-  2 weeks  - medication changes   Other Instructions
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
## 2022-12-04 DIAGNOSIS — Z1231 Encounter for screening mammogram for malignant neoplasm of breast: Secondary | ICD-10-CM | POA: Diagnosis not present

## 2022-12-08 ENCOUNTER — Encounter: Payer: Self-pay | Admitting: Gastroenterology

## 2022-12-08 ENCOUNTER — Ambulatory Visit: Payer: Medicare HMO | Admitting: Gastroenterology

## 2022-12-08 ENCOUNTER — Other Ambulatory Visit (INDEPENDENT_AMBULATORY_CARE_PROVIDER_SITE_OTHER): Payer: Medicare HMO

## 2022-12-08 VITALS — BP 132/60 | HR 84 | Ht 62.5 in | Wt 128.5 lb

## 2022-12-08 DIAGNOSIS — R197 Diarrhea, unspecified: Secondary | ICD-10-CM

## 2022-12-08 DIAGNOSIS — K529 Noninfective gastroenteritis and colitis, unspecified: Secondary | ICD-10-CM

## 2022-12-08 LAB — CBC WITH DIFFERENTIAL/PLATELET
Basophils Absolute: 0.1 10*3/uL (ref 0.0–0.1)
Basophils Relative: 0.8 % (ref 0.0–3.0)
Eosinophils Absolute: 0.5 10*3/uL (ref 0.0–0.7)
Eosinophils Relative: 7.1 % — ABNORMAL HIGH (ref 0.0–5.0)
HCT: 40.1 % (ref 36.0–46.0)
Hemoglobin: 13.7 g/dL (ref 12.0–15.0)
Lymphocytes Relative: 25.3 % (ref 12.0–46.0)
Lymphs Abs: 1.9 10*3/uL (ref 0.7–4.0)
MCHC: 34.1 g/dL (ref 30.0–36.0)
MCV: 82.4 fl (ref 78.0–100.0)
Monocytes Absolute: 0.6 10*3/uL (ref 0.1–1.0)
Monocytes Relative: 7.7 % (ref 3.0–12.0)
Neutro Abs: 4.4 10*3/uL (ref 1.4–7.7)
Neutrophils Relative %: 59.1 % (ref 43.0–77.0)
Platelets: 499 10*3/uL — ABNORMAL HIGH (ref 150.0–400.0)
RBC: 4.87 Mil/uL (ref 3.87–5.11)
RDW: 14.2 % (ref 11.5–15.5)
WBC: 7.5 10*3/uL (ref 4.0–10.5)

## 2022-12-08 LAB — COMPREHENSIVE METABOLIC PANEL
ALT: 20 U/L (ref 0–35)
AST: 18 U/L (ref 0–37)
Albumin: 4 g/dL (ref 3.5–5.2)
Alkaline Phosphatase: 90 U/L (ref 39–117)
BUN: 24 mg/dL — ABNORMAL HIGH (ref 6–23)
CO2: 30 mEq/L (ref 19–32)
Calcium: 10.3 mg/dL (ref 8.4–10.5)
Chloride: 101 mEq/L (ref 96–112)
Creatinine, Ser: 0.92 mg/dL (ref 0.40–1.20)
GFR: 58.1 mL/min — ABNORMAL LOW (ref >60.00)
Glucose, Bld: 94 mg/dL (ref 70–99)
Potassium: 4.2 mEq/L (ref 3.5–5.1)
Sodium: 139 mEq/L (ref 135–145)
Total Bilirubin: 0.4 mg/dL (ref 0.2–1.2)
Total Protein: 6.9 g/dL (ref 6.0–8.3)

## 2022-12-08 LAB — C-REACTIVE PROTEIN: CRP: <1 mg/dL (ref 0.5–20.0)

## 2022-12-08 MED ORDER — MESALAMINE 1.2 G PO TBEC: 2.4000 g | DELAYED_RELEASE_TABLET | Freq: Every day | ORAL | 3 refills | Status: DC
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
## 2022-12-08 NOTE — Patient Instructions (Addendum)
Your provider has requested that you go to the basement level for lab work before leaving today. Press "B" on the elevator. The lab is located at the first door on the left as you exit the elevator.  Avoid NSAIDs.  Try to avoid dairy and sugar substitutes.   We have sent the following medications to your pharmacy for you to pick up at your convenience: Lialda 2.4 g daily.  _______________________________________________________  If your blood pressure at your visit was 140/90 or greater, please contact your primary care physician to follow up on this.  _______________________________________________________  If you are age 14 or older, your body mass index should be between 23-30. Your Body mass index is 23.13 kg/m. If this is out of the aforementioned range listed, please consider follow up with your Primary Care Provider.  If you are age 1 or younger, your body mass index should be between 19-25. Your Body mass index is 23.13 kg/m. If this is out of the aformentioned range listed, please consider follow up with your Primary Care Provider.   ________________________________________________________  The [place] [facility] providers would like to encourage you to use [facility] to communicate with providers for non-urgent requests or questions.  Due to long hold times on the telephone, sending your provider a message by [facility] may be a faster and more efficient way to get a response.  Please allow 48 business hours for a response.  Please remember that this is for non-urgent requests.  _______________________________________________________

## 2022-12-11 ENCOUNTER — Other Ambulatory Visit: Payer: Self-pay

## 2022-12-11 DIAGNOSIS — R748 Abnormal levels of other serum enzymes: Secondary | ICD-10-CM

## 2022-12-14 DIAGNOSIS — Z5189 Encounter for other specified aftercare: Secondary | ICD-10-CM | POA: Diagnosis not present

## 2022-12-15 ENCOUNTER — Telehealth: Payer: Self-pay | Admitting: Emergency Medicine

## 2022-12-15 ENCOUNTER — Other Ambulatory Visit (INDEPENDENT_AMBULATORY_CARE_PROVIDER_SITE_OTHER): Payer: Medicare HMO

## 2022-12-15 ENCOUNTER — Telehealth: Payer: Self-pay | Admitting: Cardiovascular Disease

## 2022-12-15 DIAGNOSIS — M25511 Pain in right shoulder: Secondary | ICD-10-CM | POA: Diagnosis not present

## 2022-12-15 DIAGNOSIS — R748 Abnormal levels of other serum enzymes: Secondary | ICD-10-CM | POA: Diagnosis not present

## 2022-12-15 DIAGNOSIS — R197 Diarrhea, unspecified: Secondary | ICD-10-CM | POA: Diagnosis not present

## 2022-12-15 LAB — BASIC METABOLIC PANEL
BUN: 20 mg/dL (ref 6–23)
CO2: 28 mEq/L (ref 19–32)
Calcium: 9.9 mg/dL (ref 8.4–10.5)
Chloride: 102 mEq/L (ref 96–112)
Creatinine, Ser: 0.96 mg/dL (ref 0.40–1.20)
GFR: 55.2 mL/min — ABNORMAL LOW (ref >60.00)
Glucose, Bld: 104 mg/dL — ABNORMAL HIGH (ref 70–99)
Potassium: 4 mEq/L (ref 3.5–5.1)
Sodium: 140 mEq/L (ref 135–145)

## 2022-12-15 NOTE — Telephone Encounter (Signed)
Patient given recommendation given to patient . She states understanding of all.  She does ask to wait on sending the Olmesartan until she looks over things and thinks about. She will call back if she decides to start the medication

## 2022-12-15 NOTE — Telephone Encounter (Signed)
     Called to discuss Dr Croitoru's recommendations. No answer. Left message and call back number.

## 2022-12-15 NOTE — Telephone Encounter (Signed)
Patient is returning call regarding Dr. Sallyanne Kuster recommendations.

## 2022-12-15 NOTE — Telephone Encounter (Signed)
I still believe it is not likely that the Delene Loll is the cause of the symptoms, but we will gladly substitute it with a different medication to see if that helps.  Please stop Entresto and start olmesartan 20 mg once daily (she took losartan without difficulty in the past; this is a stronger and longer lasting relative of losartan that will keep her blood pressure better controlled). However, if the back pain and abdominal pain do not subside within a week or 2 will discontinue the Entresto, I strongly encouraged her to get this checked out by somebody.  It is possible that it could be a compression fraction of the lumbar spine with pain radiating through the compressed nerve roots to worse the anterior abdomen.  Many other possible explanations as well.

## 2022-12-15 NOTE — Telephone Encounter (Signed)
Pt gave a note to the receptionist yesterday 12/14/22 to give to Dr Sallyanne Kuster.  The note states that the pt is having pain in back and abdomen at times. It has brought her blood pressure down . The s/s came on after using it for 3-4 weeks. She had an appt across the hall at [facility], that is why she sent the not.  He reviewed the note and states that these are not typical side effects from [place].   She said that she looked up the side effects and they are not typical  Pain starts in lower back and goes around to stomach; and the pain did not start until after taking this med for a few weeks. She is wondering if there are some alternative to this medication.   Told her I would send this info to Dr Sallyanne Kuster

## 2022-12-16 ENCOUNTER — Telehealth: Payer: Self-pay | Admitting: Cardiovascular Disease

## 2022-12-16 NOTE — Telephone Encounter (Signed)
Thanks

## 2022-12-16 NOTE — Telephone Encounter (Signed)
Called patient, she states she will just stay on the [facility], she does not wish to switch to something else at this time. She contacted her pharmacy and looked up the medication as well.   I did advise per Dr.Croitoru that if the back pain continued (when stopping Entresto) she needed to see someone, patient verbalized understanding, states she will do this anyway to be evaluated.   Advised I would send to Dr. Loletha Grayer to advise.   Thanks!

## 2022-12-16 NOTE — Telephone Encounter (Signed)
Patient called and stated that she will stay on current medication and not take the new medication that was prescribed to her. Please call back

## 2022-12-20 LAB — CALPROTECTIN, FECAL: Calprotectin, Fecal: 100 ug/g (ref 0–120)

## 2022-12-25 DIAGNOSIS — Z5189 Encounter for other specified aftercare: Secondary | ICD-10-CM | POA: Diagnosis not present

## 2023-01-05 ENCOUNTER — Ambulatory Visit: Payer: Medicare HMO

## 2023-01-11 DIAGNOSIS — L0390 Cellulitis, unspecified: Secondary | ICD-10-CM | POA: Diagnosis not present

## 2023-01-11 DIAGNOSIS — I10 Essential (primary) hypertension: Secondary | ICD-10-CM | POA: Diagnosis not present

## 2023-01-11 DIAGNOSIS — T300 Burn of unspecified body region, unspecified degree: Secondary | ICD-10-CM | POA: Diagnosis not present

## 2023-01-11 DIAGNOSIS — S91301D Unspecified open wound, right foot, subsequent encounter: Secondary | ICD-10-CM | POA: Diagnosis not present

## 2023-01-14 DIAGNOSIS — I110 Hypertensive heart disease with heart failure: Secondary | ICD-10-CM | POA: Diagnosis not present

## 2023-01-14 DIAGNOSIS — I420 Dilated cardiomyopathy: Secondary | ICD-10-CM | POA: Diagnosis not present

## 2023-01-14 DIAGNOSIS — D473 Essential (hemorrhagic) thrombocythemia: Secondary | ICD-10-CM | POA: Diagnosis not present

## 2023-01-14 DIAGNOSIS — K529 Noninfective gastroenteritis and colitis, unspecified: Secondary | ICD-10-CM | POA: Diagnosis not present

## 2023-01-14 DIAGNOSIS — T300 Burn of unspecified body region, unspecified degree: Secondary | ICD-10-CM | POA: Diagnosis not present

## 2023-01-14 DIAGNOSIS — I5042 Chronic combined systolic (congestive) and diastolic (congestive) heart failure: Secondary | ICD-10-CM | POA: Diagnosis not present

## 2023-01-14 DIAGNOSIS — I700 Atherosclerosis of aorta: Secondary | ICD-10-CM | POA: Diagnosis not present

## 2023-01-14 DIAGNOSIS — M8580 Other specified disorders of bone density and structure, unspecified site: Secondary | ICD-10-CM | POA: Diagnosis not present

## 2023-01-29 DIAGNOSIS — M5451 Vertebrogenic low back pain: Secondary | ICD-10-CM | POA: Diagnosis not present

## 2023-02-02 ENCOUNTER — Ambulatory Visit: Payer: Medicare HMO | Admitting: Nurse Practitioner

## 2023-02-02 ENCOUNTER — Encounter: Payer: Self-pay | Admitting: Nurse Practitioner

## 2023-02-02 VITALS — BP 144/78 | HR 66 | Ht 63.0 in | Wt 131.0 lb

## 2023-02-02 DIAGNOSIS — K589 Irritable bowel syndrome without diarrhea: Secondary | ICD-10-CM | POA: Diagnosis not present

## 2023-02-02 NOTE — Patient Instructions (Addendum)
Contact our office if your diarrhea recurs.  Please follow up with your primary care physician in one or two months to have a repeat abdominal exam as I heard an abdominal bruit to your central upper abdomen which can be normal after eating a meal. I recommend for you to fast for 6 hours prior to repeating an abdominal exam with your primary care provider.  Thank you for trusting me with your gastrointestinal care!   Alcide Evener, CRNP

## 2023-02-02 NOTE — Progress Notes (Signed)
02/02/2023 Katie Vasquez [id number] 05-21-1941   Chief Complaint: Diarrhea follow up  History of Present Illness: Katie Vasquez is an 82 year old female with a past medical history of arthritis, depression, hypertension, anemia, chronic diarrhea and colon polyps.  Past hysterectomy.    She was previously followed by [facility].  Her most recent colonoscopy by Dr. Marca Ancona 06/22/2022 showed colitis in the cecum, a 5 mm sigmoid colon polyp, nonbleeding internal hemorrhoids, and hypertrophied anal papillae. Pathology results show a tubular adenoma and benign ulcerated colonic mucosa negative for dysplasia. Random colon biopsy showed mildly active chronic colitis negative for dysplasia.  She was prescribed Sulfasalazine but the cost was prohibitive and the patient did not started. Colestipol was used in the past without improvement.  She elected to transition her GI management from [facility] to [facility] gastro[facility]ology. She was seen by Dr. Orvan Falconer on 12/08/2022 regarding chronic diarrhea. She previously passed 3-4 loose to watery stools with bits of solid pieces.Dr. Orvan Falconer prescribed Lialda 2.4 gm po QD, however, the patient did not start this medication as her diarrhea abated after she started eating oatmeal every morning for breakfast. Fecal calprotectin level was 100. She is passing a normal formed brown BM daily for the past 6 weeks.  Her appetite is good and her weight is stable.  Brother with history of Crohn's disease.  She denies any other complaints today.     Latest Ref Rng & Units 12/08/2022   11:36 AM 11/19/2022   12:49 PM 09/04/2022    3:47 AM  CBC  WBC 4.0 - 10.5 K/uL 7.5  8.4  13.7   Hemoglobin 12.0 - 15.0 g/dL 95.2  84.1  32.4   Hematocrit 36.0 - 46.0 % 40.1  40.3  35.4   Platelets 150.0 - 400.0 K/uL 499.0  506  431         Latest Ref Rng & Units 12/15/2022    1:22 PM 12/08/2022   11:36 AM 09/03/2022    3:23 AM  CMP  Glucose 70 - 99 mg/dL 401  94  027   BUN 6 - 23 mg/dL 20  24  18     Creatinine 0.40 - 1.20 mg/dL 2.53  6.64  4.03   Sodium 135 - 145 mEq/L 140  139  132   Potassium 3.5 - 5.1 mEq/L 4.0  4.2  4.2   Chloride 96 - 112 mEq/L 102  101  98   CO2 19 - 32 mEq/L 28  30  26    Calcium 8.4 - 10.5 mg/dL 9.9  47.4  8.5   Total Protein 6.0 - 8.3 g/dL  6.9    Total Bilirubin 0.2 - 1.2 mg/dL  0.4    Alkaline Phos 39 - 117 U/L  90    AST 0 - 37 U/L  18    ALT 0 - 35 U/L  20      PAST GI PROCEDURES:   Flexible sigmoidoscopy in 2009 "essentially normal" -Colonoscopy with Dr. Pati Gallo 06/22/2022 showed colitis in the cecum, a 5 mm sigmoid colon polyp, nonbleeding internal hemorrhoids, and hypertrophied anal papillae.  Pathology results show a tubular adenoma and benign ulcerated colonic mucosa negative for dysplasia.  At the cecum.  Random colon biopsy showed mildly active chronic colitis negative for dysplasia.  It is unclear if microscopic colitis was considered in the differential.  The pathology notes mention no granulomas or viral pathic inclusions present  Current Outpatient Medications on File Prior to Visit  Medication Sig Dispense Refill   aspirin EC 81 MG tablet Take 81 mg by mouth daily. Swallow whole.     chlorthalidone (HYGROTON) 25 MG tablet Take 25 mg by mouth every morning.     meloxicam (MOBIC) 7.5 MG tablet Take 7.5 mg by mouth daily as needed.     Multiple Vitamins-Minerals (MULTIVITAMIN WITH MINERALS) tablet Take 1 tablet by mouth daily. Spectra Vite     potassium chloride (MICRO-K) 10 MEQ CR capsule Take 10 mEq by mouth at bedtime.     spironolactone (ALDACTONE) 25 MG tablet Take 25 mg by mouth every morning.     mesalamine (LIALDA) 1.2 g EC tablet Take 2 tablets (2.4 g total) by mouth daily with breakfast. (Patient not taking: Reported on 02/02/2023) 60 tablet 3   No current facility-administered medications on file prior to visit.   Allergies  Allergen Reactions   Entresto [Sacubitril-Valsartan] Other (See Comments)    Back pain, neck pain, abdominal  pain   Other     Mycins= GI upset   Keflex [Cephalexin] Rash    Current Medications, Allergies, Past Medical History, Past Surgical History, Family History and Social History were reviewed in [employer] record.  Review of Systems:   Constitutional: Negative for fever, sweats, chills or weight loss.  Respiratory: Negative for shortness of breath.   Cardiovascular: Negative for chest pain, palpitations and leg swelling.  Gastrointestinal: See HPI.  Musculoskeletal: Negative for back pain or muscle aches.  Neurological: Negative for dizziness, headaches or paresthesias.   Physical Exam: BP (!) 144/78   Pulse 66   Ht 5\' 3"  (1.6 m)   Wt 131 lb (59.4 kg)   BMI 23.21 kg/m  Wt Readings from Last 3 Encounters:  02/02/23 131 lb (59.4 kg)  12/08/22 128 lb 8 oz (58.3 kg)  12/02/22 129 lb (58.5 kg)    General: 82 year old female in no acute distress. Head: Normocephalic and atraumatic. Eyes: No scleral icterus. Conjunctiva pink . Ears: Normal auditory acuity. Mouth: Dentition intact. No ulcers or lesions.  Lungs: Clear throughout to auscultation. Heart: Regular rate and rhythm, no murmur. Abdomen: Soft, nontender and nondistended. No masses or hepatomegaly. + epigastric bruit (patient ate breakfast prior to office appointment/exam).  Rectal: Deferred.  Musculoskeletal: Symmetrical with no gross deformities. Extremities: No edema. Neurological: Alert oriented x 4. No focal deficits.  Psychological: Alert and cooperative. Normal mood and affect  Assessment and Recommendations:  82 year old female with chronic diarrhea.  Colonoscopy 06/2022 by [facility] showed mildly active chronic colitis in the cecum which was nondiagnostic for IBD or microscopic colitis.  Diarrhea spontaneously abated after she started eating oatmeal every morning. -Patient to contact our office if diarrhea recurs -Avoid dairy products, otherwise diet as tolerated  History of colon polyps.   One 5 mm tubular adenomatous polyp removed from the sigmoid colon per colonoscopy 06/2022 -No further colon polyp surveillance colonoscopies due to age  Bruit auscultated to the epigastrium on exam today -Advised the patient to follow-up with her PCP to have repeat abdominal exam and to reassess for an upper abdominal bruit.  Patient advised to fast for 6 hours prior to repeat exam.

## 2023-02-03 NOTE — Progress Notes (Signed)
Attending Physician's Attestation   I have reviewed the chart.   I agree with the Advanced Practitioner's note, impression, and recommendations with any updates as below. Glad to hear she is doing better.  If she were to have recurrent issues then do recommend as had been previous plan by Dr. Orvan Falconer for patient to be initiated on Lialda.  If she fails this then potential repeat colonoscopy with further biopsies may be required.    Corliss Parish, MD [facility] Office # [phone]

## 2023-02-14 ENCOUNTER — Other Ambulatory Visit: Payer: Self-pay | Admitting: Cardiology

## 2023-03-19 ENCOUNTER — Other Ambulatory Visit: Payer: Self-pay | Admitting: Gastroenterology

## 2023-04-16 ENCOUNTER — Telehealth: Payer: Self-pay | Admitting: Nurse Practitioner

## 2023-04-16 ENCOUNTER — Other Ambulatory Visit: Payer: Self-pay

## 2023-04-16 DIAGNOSIS — R197 Diarrhea, unspecified: Secondary | ICD-10-CM

## 2023-04-16 MED ORDER — MESALAMINE 1.2 G PO TBEC: 4.8000 g | DELAYED_RELEASE_TABLET | Freq: Every day | ORAL | 1 refills | Status: DC

## 2023-04-16 NOTE — Telephone Encounter (Signed)
Inbound call from patient stating she is still having sever diarrhea. States she has been taking mesalamine as instructed but it has made her diarrhea worse. Requesting a call back to discuss further. Please advise, thank you.

## 2023-04-16 NOTE — Telephone Encounter (Signed)
Spoke with patient regarding NP recommendations. Orders placed. She will come by next Wednesday to pick up stool kit & collect labs. OV scheduled for 06/30/23 at 3:00 pm with Jill Side, NP. Pt advised to call back if symptoms do not improve or worsen. Refill sent to pharmacy as well.

## 2023-04-16 NOTE — Telephone Encounter (Signed)
Patient called in with complaints of diarrhea for over a month. She started taking the mesalamine exactly 43 days ago when symptoms started & so far no relief, diarrhea has worsened. She states that her stools have been watery, dark, and forceful. Currently goes to the bathroom at least three times a day. She has also tried OTC anti-diarrheal which previously helped, but no longer helps. Denies pain or bleeding. Last seen for OV on 02/02/23 with Jill Side, NP. Will route to NP for recommendations.

## 2023-04-16 NOTE — Telephone Encounter (Signed)
Kaira, pls send patient to lab for CBC, CMP, CRP and [facility] GI pathogen panel to include C. Diff. Increase Mesalamine 1.2 gm tabs to four tabs po every day # 120, 1 refill. Push fluids and bland diet. Patient to go to the ED if she has severe diarrhea/abd pain or bloody diarrhea. Pls schedule patient for office visit. She may require a repeat diagnostic colonoscopy. THX.   Dr. Cathie Beams

## 2023-04-16 NOTE — Telephone Encounter (Signed)
Agree with plan outlined by CKS. GM

## 2023-04-21 ENCOUNTER — Other Ambulatory Visit: Payer: Medicare HMO

## 2023-04-21 DIAGNOSIS — R197 Diarrhea, unspecified: Secondary | ICD-10-CM | POA: Diagnosis not present

## 2023-04-21 DIAGNOSIS — Z96652 Presence of left artificial knee joint: Secondary | ICD-10-CM | POA: Diagnosis not present

## 2023-04-21 LAB — CBC
HCT: 40.1 % (ref 36.0–46.0)
Hemoglobin: 13.4 g/dL (ref 12.0–15.0)
MCHC: 33.3 g/dL (ref 30.0–36.0)
MCV: 85.1 fl (ref 78.0–100.0)
Platelets: 520 10*3/uL — ABNORMAL HIGH (ref 150.0–400.0)
RBC: 4.71 Mil/uL (ref 3.87–5.11)
RDW: 13.2 % (ref 11.5–15.5)
WBC: 9.8 10*3/uL (ref 4.0–10.5)

## 2023-04-21 LAB — COMPREHENSIVE METABOLIC PANEL
ALT: 18 U/L (ref 0–35)
AST: 18 U/L (ref 0–37)
Albumin: 4.3 g/dL (ref 3.5–5.2)
Alkaline Phosphatase: 75 U/L (ref 39–117)
BUN: 22 mg/dL (ref 6–23)
CO2: 25 mEq/L (ref 19–32)
Calcium: 9.8 mg/dL (ref 8.4–10.5)
Chloride: 101 mEq/L (ref 96–112)
Creatinine, Ser: 0.98 mg/dL (ref 0.40–1.20)
GFR: 53.72 mL/min — ABNORMAL LOW (ref >60.00)
Glucose, Bld: 110 mg/dL — ABNORMAL HIGH (ref 70–99)
Potassium: 3.5 mEq/L (ref 3.5–5.1)
Sodium: 136 mEq/L (ref 135–145)
Total Bilirubin: 0.3 mg/dL (ref 0.2–1.2)
Total Protein: 6.8 g/dL (ref 6.0–8.3)

## 2023-04-21 LAB — C-REACTIVE PROTEIN: CRP: <1 mg/dL (ref 0.5–20.0)

## 2023-04-28 ENCOUNTER — Encounter: Payer: Self-pay | Admitting: Cardiovascular Disease

## 2023-04-28 ENCOUNTER — Ambulatory Visit: Payer: Medicare HMO | Attending: Cardiovascular Disease | Admitting: Cardiovascular Disease

## 2023-04-28 VITALS — BP 146/72 | HR 73 | Ht 63.0 in | Wt 131.2 lb

## 2023-04-28 DIAGNOSIS — I10 Essential (primary) hypertension: Secondary | ICD-10-CM

## 2023-04-28 DIAGNOSIS — I5042 Chronic combined systolic (congestive) and diastolic (congestive) heart failure: Secondary | ICD-10-CM | POA: Diagnosis not present

## 2023-04-28 DIAGNOSIS — I447 Left bundle-branch block, unspecified: Secondary | ICD-10-CM

## 2023-04-28 NOTE — Patient Instructions (Signed)
Medication Instructions:  Dr Royann Shivers would recommend increasing your Losartan to 50 mg. Please call and let us know if you need a prescription sent in; if you agree to this change. *If you need a refill on your cardiac medications before your next appointment, please call your pharmacy*  Follow-Up: At [facility], you and your health needs are our priority.  As part of our continuing mission to provide you with exceptional heart care, we have created designated Provider Care Teams.  These Care Teams include your primary Cardiologist (physician) and Advanced Practice Providers (APPs -  Physician Assistants and Nurse Practitioners) who all work together to provide you with the care you need, when you need it.  We recommend signing up for the patient portal called "MyChart".  Sign up information is provided on this After Visit Summary.  MyChart is used to connect with patients for Virtual Visits (Telemedicine).  Patients are able to view lab/test results, encounter notes, upcoming appointments, etc.  Non-urgent messages can be sent to your provider as well.   To learn more about what you can do with MyChart, go to [email or web address].    Your next appointment:   1 year(s)  Provider:   Thurmon Fair, MD
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
## 2023-05-03 DIAGNOSIS — M47812 Spondylosis without myelopathy or radiculopathy, cervical region: Secondary | ICD-10-CM | POA: Diagnosis not present

## 2023-05-03 DIAGNOSIS — R197 Diarrhea, unspecified: Secondary | ICD-10-CM | POA: Diagnosis not present

## 2023-05-04 ENCOUNTER — Encounter: Payer: Self-pay | Admitting: Cardiovascular Disease

## 2023-05-05 ENCOUNTER — Telehealth: Payer: Self-pay

## 2023-05-05 DIAGNOSIS — M6281 Muscle weakness (generalized): Secondary | ICD-10-CM | POA: Diagnosis not present

## 2023-05-05 DIAGNOSIS — M25662 Stiffness of left knee, not elsewhere classified: Secondary | ICD-10-CM | POA: Diagnosis not present

## 2023-05-05 NOTE — Telephone Encounter (Signed)
Pt called. Pt verbalized understanding of normal lab results. Pt would like for Colleen to give her a call.

## 2023-05-05 NOTE — Telephone Encounter (Signed)
Contacted pt & LVM to return call regarding [facility] c diff test. Test was negative.

## 2023-05-06 ENCOUNTER — Telehealth: Payer: Self-pay | Admitting: Nurse Practitioner

## 2023-05-06 NOTE — Telephone Encounter (Signed)
I called patient and discussed her [facility] GI pathogen panel was normal. She had several days of loose stool last week then back to solid stools. She started eating wheat bread which resulted in formed stools. She does not wish to try Lialda as previously prescribed by Dr. Orvan Falconer. She will try Benefiber 1 tablespoon every day to bulk up her stools. She will starts with one teaspoon every day and gradually increase to 1 tablespoon as tolerated. She will contact me if her diarrhea recurs.

## 2023-05-06 NOTE — Telephone Encounter (Signed)
See other phone note

## 2023-05-21 DIAGNOSIS — M6281 Muscle weakness (generalized): Secondary | ICD-10-CM | POA: Diagnosis not present

## 2023-05-31 DIAGNOSIS — D473 Essential (hemorrhagic) thrombocythemia: Secondary | ICD-10-CM | POA: Diagnosis not present

## 2023-05-31 NOTE — Telephone Encounter (Signed)
Pt stated that she started having loose stools about two weeks ago. Imodium is not helping and states that everything that she eats goes right through her. Pt states that she is having about 9 loose stools a day. Please review and advise.

## 2023-05-31 NOTE — Telephone Encounter (Signed)
Patient called states her issue has worsened and is seeking further advise.

## 2023-05-31 NOTE — Telephone Encounter (Signed)
Katie Vasquez, pls contact patient and verify if she ever took Lialda as previously recommended?

## 2023-06-01 NOTE — Telephone Encounter (Signed)
Pt made aware of recent results and Alcide Evener NP recommendations: Pt verbalized understanding with all questions answered.

## 2023-06-01 NOTE — Telephone Encounter (Signed)
Pt stated that she did take the Lialda and it only made things worse and she stopped taking the medication.

## 2023-06-01 NOTE — Telephone Encounter (Signed)
Katie Vasquez, thank you for the update. As previously reviewed, [facility] GI pathogen panel was negative.  A diagnostic colonoscopy was previously considered if her diarrhea persisted. Colestid was ineffective when taken in the past. Patient has an office visit with Gunnar Fusi on 9/3. Patient to push fluids, maintain a bland diet. If tolerated, she can take Imodium one to two tabs po bid, stop if no BM within 24 hours. Patient to contact office if it worsens prior to her office visit.

## 2023-06-02 DIAGNOSIS — K529 Noninfective gastroenteritis and colitis, unspecified: Secondary | ICD-10-CM | POA: Diagnosis not present

## 2023-06-03 DIAGNOSIS — Z96652 Presence of left artificial knee joint: Secondary | ICD-10-CM | POA: Diagnosis not present

## 2023-06-08 ENCOUNTER — Encounter: Payer: Self-pay | Admitting: Nurse Practitioner

## 2023-06-08 ENCOUNTER — Ambulatory Visit: Payer: Medicare HMO | Admitting: Nurse Practitioner

## 2023-06-08 VITALS — BP 126/70 | HR 76 | Ht 64.0 in | Wt 125.0 lb

## 2023-06-08 DIAGNOSIS — K529 Noninfective gastroenteritis and colitis, unspecified: Secondary | ICD-10-CM | POA: Diagnosis not present

## 2023-06-08 MED ORDER — NA SULFATE-K SULFATE-MG SULF 17.5-3.13-1.6 GM/177ML PO SOLN: 1.0000 | Freq: Once | ORAL | 0 refills | Status: AC

## 2023-06-08 NOTE — Patient Instructions (Signed)
You have been scheduled for a colonoscopy. Please follow written instructions given to you at your visit today.   Please pick up your prep supplies at the pharmacy within the next 1-3 days.  If you use inhalers (even only as needed), please bring them with you on the day of your procedure.  DO NOT TAKE 7 DAYS PRIOR TO TEST- Trulicity (dulaglutide) Ozempic, Wegovy (semaglutide) Mounjaro (tirzepatide) Bydureon Bcise (exanatide extended release)  DO NOT TAKE 1 DAY PRIOR TO YOUR TEST Rybelsus (semaglutide) Adlyxin (lixisenatide) Victoza (liraglutide) Byetta (exanatide) ___________________________________________________________________________   Due to recent changes in healthcare laws, you may see the results of your imaging and laboratory studies on MyChart before your provider has had a chance to review them.  We understand that in some cases there may be results that are confusing or concerning to you. Not all laboratory results come back in the same time frame and the provider may be waiting for multiple results in order to interpret others.  Please give [place] 48 hours in order for your provider to thoroughly review all the results before contacting the office for clarification of your results.    I appreciate the  opportunity to care for you  Thank You   Midge Minium
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
## 2023-06-08 NOTE — Progress Notes (Signed)
Attending Physician's Attestation   I have reviewed the chart.   I agree with the Advanced Practitioner's note, impression, and recommendations with any updates as below. I think repeat endoscopic evaluation is reasonable in the setting of the previously documented colitis on Dr. Orvan Falconer prior colonoscopy.  With this being said, would consider SIBO breath testing for possible issues and consider pancreatic elastase testing, but if she is just having watery diarrhea, we could have an abnormal finding GIST from that too.  We may need to reconsider if biopsies do not show evidence of lymphocytic/collagenous colitis repeat colestipol versus cholestyramine use in future.  Time will tell.   Corliss Parish, MD  Gastroenterology Advanced Endoscopy Office # [phone]

## 2023-06-10 DIAGNOSIS — L728 Other follicular cysts of the skin and subcutaneous tissue: Secondary | ICD-10-CM | POA: Diagnosis not present

## 2023-06-16 ENCOUNTER — Encounter: Payer: Self-pay | Admitting: Gastroenterology

## 2023-06-16 DIAGNOSIS — M47812 Spondylosis without myelopathy or radiculopathy, cervical region: Secondary | ICD-10-CM | POA: Diagnosis not present

## 2023-06-21 DIAGNOSIS — M47812 Spondylosis without myelopathy or radiculopathy, cervical region: Secondary | ICD-10-CM | POA: Diagnosis not present

## 2023-06-30 ENCOUNTER — Ambulatory Visit: Payer: Medicare HMO | Admitting: Nurse Practitioner

## 2023-06-30 ENCOUNTER — Ambulatory Visit: Payer: Medicare HMO | Admitting: Gastroenterology

## 2023-06-30 ENCOUNTER — Encounter: Payer: Self-pay | Admitting: Gastroenterology

## 2023-06-30 VITALS — BP 153/70 | HR 68 | Temp 97.1°F | Resp 21 | Ht 64.0 in | Wt 125.0 lb

## 2023-06-30 DIAGNOSIS — K52831 Collagenous colitis: Secondary | ICD-10-CM | POA: Diagnosis not present

## 2023-06-30 DIAGNOSIS — K529 Noninfective gastroenteritis and colitis, unspecified: Secondary | ICD-10-CM

## 2023-06-30 DIAGNOSIS — R197 Diarrhea, unspecified: Secondary | ICD-10-CM | POA: Diagnosis not present

## 2023-06-30 DIAGNOSIS — I10 Essential (primary) hypertension: Secondary | ICD-10-CM | POA: Diagnosis not present

## 2023-06-30 DIAGNOSIS — F32A Depression, unspecified: Secondary | ICD-10-CM | POA: Diagnosis not present

## 2023-06-30 MED ORDER — SODIUM CHLORIDE 0.9 % IV SOLN: 500.0000 mL | Freq: Once | INTRAVENOUS | Status: DC

## 2023-06-30 NOTE — Patient Instructions (Signed)
Discharge instructions given. Biopsies taken. Handout on Hemorrhoids. YOU HAD AN ENDOSCOPIC PROCEDURE TODAY AT THE [facility]:   Refer to the procedure report that was given to you for any specific questions about what was found during the examination.  If the procedure report does not answer your questions, please call your gastroenterologist to clarify.  If you requested that your care partner not be given the details of your procedure findings, then the procedure report has been included in a sealed envelope for you to review at your convenience later.  YOU SHOULD EXPECT: Some feelings of bloating in the abdomen. Passage of more gas than usual.  Walking can help get rid of the air that was put into your GI tract during the procedure and reduce the bloating. If you had a lower endoscopy (such as a colonoscopy or flexible sigmoidoscopy) you may notice spotting of blood in your stool or on the toilet paper. If you underwent a bowel prep for your procedure, you may not have a normal bowel movement for a few days.  Please Note:  You might notice some irritation and congestion in your nose or some drainage.  This is from the oxygen used during your procedure.  There is no need for concern and it should clear up in a day or so.  SYMPTOMS TO REPORT IMMEDIATELY:  Following lower endoscopy (colonoscopy or flexible sigmoidoscopy):  Excessive amounts of blood in the stool  Significant tenderness or worsening of abdominal pains  Swelling of the abdomen that is new, acute  Fever of 100F or higher   For urgent or emergent issues, a gastroenterologist can be reached at any hour by calling [phone]. Do not use MyChart messaging for urgent concerns.    DIET:  We do recommend a small meal at first, but then you may proceed to your regular diet.  Drink plenty of fluids but you should avoid alcoholic beverages for 24 hours.  ACTIVITY:  You should plan to take it easy for the rest of today  and you should NOT DRIVE or use heavy machinery until tomorrow (because of the sedation medicines used during the test).    FOLLOW UP: Our staff will call the number listed on your records the next business day following your procedure.  We will call around 7:15- 8:00 am to check on you and address any questions or concerns that you may have regarding the information given to you following your procedure. If we do not reach you, we will leave a message.     If any biopsies were taken you will be contacted by phone or by letter within the next 1-3 weeks.  Please call us at [phone] if you have not heard about the biopsies in 3 weeks.    SIGNATURES/CONFIDENTIALITY: You and/or your care partner have signed paperwork which will be entered into your electronic medical record.  These signatures attest to the fact that that the information above on your After Visit Summary has been reviewed and is understood.  Full responsibility of the confidentiality of this discharge information lies with you and/or your care-partner.

## 2023-06-30 NOTE — Op Note (Signed)
[facility] Patient Name: Katie Vasquez Procedure Date: 06/30/2023 2:19 PM MRN: [id number] Endoscopist: Corliss Parish , MD, [id number] Age: 82 Referring MD:  Date of Birth: Nov 19, 1940 Gender: Female Account #: [id number] Procedure:                Colonoscopy Indications:              Chronic diarrhea Medicines:                Monitored Anesthesia Care Procedure:                Pre-Anesthesia Assessment:                           - Prior to the procedure, a History and Physical                            was performed, and patient medications and                            allergies were reviewed. The patient's tolerance of                            previous anesthesia was also reviewed. The risks                            and benefits of the procedure and the sedation                            options and risks were discussed with the patient.                            All questions were answered, and informed consent                            was obtained. Prior Anticoagulants: The patient has                            taken no anticoagulant or antiplatelet agents                            except for aspirin. ASA Grade Assessment: II - A                            patient with mild systemic disease. After reviewing                            the risks and benefits, the patient was deemed in                            satisfactory condition to undergo the procedure.                           After obtaining informed consent, the colonoscope  was passed under direct vision. Throughout the                            procedure, the patient's blood pressure, pulse, and                            oxygen saturations were monitored continuously. The                            Olympus CF-HQ190L [id number]) Colonoscope was                            introduced through the anus and advanced to the the                            cecum, identified by  appendiceal orifice and                            ileocecal valve. The colonoscopy was somewhat                            difficult due to a tortuous colon. Successful                            completion of the procedure was aided by changing                            the patient's position, using manual pressure,                            straightening and shortening the scope to obtain                            bowel loop reduction and using scope torsion. The                            patient tolerated the procedure. The quality of the                            bowel preparation was good. The ileocecal valve,                            appendiceal orifice, and rectum were photographed. Scope In: 2:28:19 PM Scope Out: 2:47:07 PM Scope Withdrawal Time: 0 hours 9 minutes 0 seconds  Total Procedure Duration: 0 hours 18 minutes 48 seconds  Findings:                 Skin tags were found on perianal exam.                           The digital rectal exam findings include                            hemorrhoids. Pertinent negatives include no  palpable rectal lesions.                           The colon (entire examined portion) was grossly                            tortuous.                           Normal mucosa was found in the entire colon.                            Biopsies for histology were taken with a cold                            forceps from the entire colon for evaluation of                            microscopic colitis.                           Non-bleeding non-thrombosed external and internal                            hemorrhoids were found during retroflexion, during                            perianal exam and during digital exam. The                            hemorrhoids were Grade II (internal hemorrhoids                            that prolapse but reduce spontaneously). Complications:            No immediate  complications. Estimated Blood Loss:     Estimated blood loss was minimal. Impression:               - Perianal skin tags found on perianal exam.                            Hemorrhoids found on digital rectal exam.                           - Tortuous colon.                           - Normal mucosa in the entire examined colon.                            Biopsied.                           - Non-bleeding non-thrombosed external and internal                            hemorrhoids. Recommendation:           -  The patient will be observed post-procedure,                            until all discharge criteria are met.                           - Discharge patient to home.                           - Patient has a contact number available for                            emergencies. The signs and symptoms of potential                            delayed complications were discussed with the                            patient. Return to normal activities tomorrow.                            Written discharge instructions were provided to the                            patient.                           - High fiber diet.                           - Continue present medications.                           - Await pathology results.                           - No repeat colonoscopy due to age.                           - The findings and recommendations were discussed                            with the patient.                           - The findings and recommendations were discussed                            with the patient's family. Corliss Parish, MD 06/30/2023 2:51:57 PM

## 2023-06-30 NOTE — Progress Notes (Signed)
Sedate, gd SR, tolerated procedure well, VSS, report to RN 
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
## 2023-06-30 NOTE — Progress Notes (Signed)
Called to room to assist during endoscopic procedure.  Patient ID and intended procedure confirmed with present staff. Received instructions for my participation in the procedure from the performing physician.  

## 2023-07-01 ENCOUNTER — Telehealth: Payer: Self-pay

## 2023-07-01 NOTE — Telephone Encounter (Signed)
  Follow up Call-     06/30/2023    2:05 PM  Call back number  Post procedure Call Back phone  # [phone]  Permission to leave phone message Yes     Patient questions:  Do you have a fever, pain , or abdominal swelling? No. Pain Score  0 *  Have you tolerated food without any problems? Yes.    Have you been able to return to your normal activities? Yes.    Do you have any questions about your discharge instructions: Diet   No. Medications  No. Follow up visit  No.  Do you have questions or concerns about your Care? No.  Actions: * If pain score is 4 or above: No action needed, pain <4.  Pt states diarrhea has returned, she is taking the probiotic and fiber supplement at this time and states it does help decrease the number of stools, encouraged fluids and foods such as brat diet, pt states she is doing that, encouraged pt to call and let us know if any worsening of sx

## 2023-07-05 LAB — SURGICAL PATHOLOGY

## 2023-07-06 ENCOUNTER — Encounter: Payer: Self-pay | Admitting: Gastroenterology

## 2023-07-07 ENCOUNTER — Other Ambulatory Visit: Payer: Self-pay

## 2023-07-07 MED ORDER — BUDESONIDE 3 MG PO CPEP: 9.0000 mg | ORAL_CAPSULE | Freq: Every day | ORAL | 0 refills | Status: DC

## 2023-07-13 ENCOUNTER — Telehealth: Payer: Self-pay | Admitting: Gastroenterology

## 2023-07-13 NOTE — Telephone Encounter (Signed)
I have spoken to the pt and answered all questions to the best of my ability.  She will continue Enotocort until seen in the office to discuss taper.      Dear Katie Vasquez,  I am writing to inform you that the biopsies taken during your recent colonoscopy showed:   FINAL DIAGNOSIS       1. Surgical [P], random colon sites :       - COLLAGENOUS COLITIS    What does this all mean? The biopsies of your colon have returned showing evidence of collagenous colitis.  This is a process where microscopically, you have collagen being deposited in the upper layers of the tissue of the colon.  This can lead individuals and patients to have recurring episodes of diarrhea or persisting diarrheal symptoms.  The treatment for this disorder can include high-dose Pepto-Bismol (bismuth) versus a steroid (budesonide) that some individuals can use periodically or some patients will need to be on daily to try to incite a flare/episode into remission.  I think it is reasonable for [place] to initiate therapy.  My team will reach out to you and see if you are willing to move forward with therapy or otherwise we will get you back into clinic to discuss things further.  Based on your age, it is not clear that you will need a repeat colonoscopy for colon cancer screening, but if you continue to have issues in the future you may need diagnostic colonoscopy at some point again.  Hopefully that will not be the case.  Please call us at [phone]   if you have persistent problems or have questions about your condition that have not been fully answered at this time.

## 2023-07-13 NOTE — Telephone Encounter (Signed)
Inbound call from patient, would like to discuss entocort medication. States she is very pleased and would also like to discuss the results she received in a letter. Please advise.

## 2023-07-17 DIAGNOSIS — H2000 Unspecified acute and subacute iridocyclitis: Secondary | ICD-10-CM | POA: Diagnosis not present

## 2023-07-19 DIAGNOSIS — H35371 Puckering of macula, right eye: Secondary | ICD-10-CM | POA: Diagnosis not present

## 2023-07-19 DIAGNOSIS — M0569 Rheumatoid arthritis of multiple sites with involvement of other organs and systems: Secondary | ICD-10-CM | POA: Diagnosis not present

## 2023-07-19 DIAGNOSIS — H353111 Nonexudative age-related macular degeneration, right eye, early dry stage: Secondary | ICD-10-CM | POA: Diagnosis not present

## 2023-07-19 DIAGNOSIS — H44112 Panuveitis, left eye: Secondary | ICD-10-CM | POA: Diagnosis not present

## 2023-07-19 DIAGNOSIS — H209 Unspecified iridocyclitis: Secondary | ICD-10-CM | POA: Diagnosis not present

## 2023-07-19 DIAGNOSIS — H43392 Other vitreous opacities, left eye: Secondary | ICD-10-CM | POA: Diagnosis not present

## 2023-07-19 DIAGNOSIS — H53132 Sudden visual loss, left eye: Secondary | ICD-10-CM | POA: Diagnosis not present

## 2023-07-19 DIAGNOSIS — H35361 Drusen (degenerative) of macula, right eye: Secondary | ICD-10-CM | POA: Diagnosis not present

## 2023-07-21 DIAGNOSIS — H43392 Other vitreous opacities, left eye: Secondary | ICD-10-CM | POA: Diagnosis not present

## 2023-07-21 DIAGNOSIS — H44112 Panuveitis, left eye: Secondary | ICD-10-CM | POA: Diagnosis not present

## 2023-07-21 DIAGNOSIS — H353111 Nonexudative age-related macular degeneration, right eye, early dry stage: Secondary | ICD-10-CM | POA: Diagnosis not present

## 2023-07-21 DIAGNOSIS — H35361 Drusen (degenerative) of macula, right eye: Secondary | ICD-10-CM | POA: Diagnosis not present

## 2023-07-21 DIAGNOSIS — M0569 Rheumatoid arthritis of multiple sites with involvement of other organs and systems: Secondary | ICD-10-CM | POA: Diagnosis not present

## 2023-07-21 DIAGNOSIS — H35371 Puckering of macula, right eye: Secondary | ICD-10-CM | POA: Diagnosis not present

## 2023-07-22 DIAGNOSIS — Z0000 Encounter for general adult medical examination without abnormal findings: Secondary | ICD-10-CM | POA: Diagnosis not present

## 2023-07-22 DIAGNOSIS — Z01818 Encounter for other preprocedural examination: Secondary | ICD-10-CM | POA: Diagnosis not present

## 2023-07-22 DIAGNOSIS — Z1331 Encounter for screening for depression: Secondary | ICD-10-CM | POA: Diagnosis not present

## 2023-07-27 ENCOUNTER — Other Ambulatory Visit (HOSPITAL_COMMUNITY)
Admission: RE | Admit: 2023-07-27 | Discharge: 2023-07-27 | Disposition: A | Discharge status: Home / Self Care | Payer: Medicare HMO | Source: Ambulatory Visit | Attending: Ophthalmology | Admitting: Ophthalmology

## 2023-07-27 DIAGNOSIS — H43392 Other vitreous opacities, left eye: Secondary | ICD-10-CM | POA: Diagnosis not present

## 2023-07-27 DIAGNOSIS — H43812 Vitreous degeneration, left eye: Secondary | ICD-10-CM | POA: Diagnosis not present

## 2023-07-28 LAB — SURGICAL PATHOLOGY

## 2023-07-29 ENCOUNTER — Other Ambulatory Visit: Payer: Self-pay | Admitting: Gastroenterology

## 2023-08-02 MED ORDER — MESALAMINE 1.2 G PO TBEC: 4.8000 g | DELAYED_RELEASE_TABLET | Freq: Every day | ORAL | 1 refills | Status: DC

## 2023-08-02 NOTE — Telephone Encounter (Signed)
Returned call to patient. Advised that Dr Meridee Score would like for her to continue Enotocort until she is seen at her follow-up appointment in Dec., at that time they will discuss tapering. Patient states that Enotocort is working and she is feeling much better. Refills sent to pharmacy for patient. Patient voiced understanding and will keep appt in Dec for follow-up.

## 2023-08-02 NOTE — Telephone Encounter (Signed)
Inbound call from patient, would like to discuss Entocort medication. Patient states she only has five pills left and is unsure if she can continue to take medication.

## 2023-08-02 NOTE — Addendum Note (Signed)
Addended byLucky Rathke on: 08/02/2023 09:50 AM   Modules accepted: Orders

## 2023-08-12 DIAGNOSIS — Z23 Encounter for immunization: Secondary | ICD-10-CM | POA: Diagnosis not present

## 2023-08-12 DIAGNOSIS — L720 Epidermal cyst: Secondary | ICD-10-CM | POA: Diagnosis not present

## 2023-09-07 DIAGNOSIS — H35363 Drusen (degenerative) of macula, bilateral: Secondary | ICD-10-CM | POA: Diagnosis not present

## 2023-09-07 DIAGNOSIS — H35373 Puckering of macula, bilateral: Secondary | ICD-10-CM | POA: Diagnosis not present

## 2023-09-07 DIAGNOSIS — H353131 Nonexudative age-related macular degeneration, bilateral, early dry stage: Secondary | ICD-10-CM | POA: Diagnosis not present

## 2023-09-10 DIAGNOSIS — H52203 Unspecified astigmatism, bilateral: Secondary | ICD-10-CM | POA: Diagnosis not present

## 2023-09-10 DIAGNOSIS — Z961 Presence of intraocular lens: Secondary | ICD-10-CM | POA: Diagnosis not present

## 2023-09-10 DIAGNOSIS — H40053 Ocular hypertension, bilateral: Secondary | ICD-10-CM | POA: Diagnosis not present

## 2023-09-22 ENCOUNTER — Ambulatory Visit: Payer: Medicare HMO | Admitting: Gastroenterology

## 2023-09-24 DIAGNOSIS — R091 Pleurisy: Secondary | ICD-10-CM | POA: Diagnosis not present

## 2023-10-06 HISTORY — PX: BACK SURGERY: SHX140

## 2023-10-12 DIAGNOSIS — H353131 Nonexudative age-related macular degeneration, bilateral, early dry stage: Secondary | ICD-10-CM | POA: Diagnosis not present

## 2023-10-12 DIAGNOSIS — H44111 Panuveitis, right eye: Secondary | ICD-10-CM | POA: Diagnosis not present

## 2023-10-12 DIAGNOSIS — H43391 Other vitreous opacities, right eye: Secondary | ICD-10-CM | POA: Diagnosis not present

## 2023-10-14 ENCOUNTER — Telehealth: Payer: Self-pay | Admitting: Gastroenterology

## 2023-10-14 DIAGNOSIS — H353111 Nonexudative age-related macular degeneration, right eye, early dry stage: Secondary | ICD-10-CM | POA: Diagnosis not present

## 2023-10-14 DIAGNOSIS — H43391 Other vitreous opacities, right eye: Secondary | ICD-10-CM | POA: Diagnosis not present

## 2023-10-14 DIAGNOSIS — H353131 Nonexudative age-related macular degeneration, bilateral, early dry stage: Secondary | ICD-10-CM | POA: Diagnosis not present

## 2023-10-14 DIAGNOSIS — H44111 Panuveitis, right eye: Secondary | ICD-10-CM | POA: Diagnosis not present

## 2023-10-14 NOTE — Telephone Encounter (Signed)
 PT is very upset that her appointment had to change this is the second time. I tried to offer an appointment with the APP and she only wants to see Mansouraty. She is requesting a nurse call because she has colitis in both eyes and cannot see out of one. Please advise.

## 2023-10-14 NOTE — Telephone Encounter (Signed)
 The pt states she was told by an eye doctor that she has inflammation in her eyes and needs to see her MD.  I did advise that GI is not the appropriate specialty for her to see. She is going to call her PCP and get advice on what she should do next.

## 2023-10-27 ENCOUNTER — Ambulatory Visit: Payer: Medicare HMO | Admitting: Gastroenterology

## 2023-11-01 DIAGNOSIS — H353131 Nonexudative age-related macular degeneration, bilateral, early dry stage: Secondary | ICD-10-CM | POA: Diagnosis not present

## 2023-11-01 DIAGNOSIS — H30111 Disseminated chorioretinal inflammation of posterior pole, right eye: Secondary | ICD-10-CM | POA: Diagnosis not present

## 2023-11-01 DIAGNOSIS — H35373 Puckering of macula, bilateral: Secondary | ICD-10-CM | POA: Diagnosis not present

## 2023-11-01 DIAGNOSIS — H35363 Drusen (degenerative) of macula, bilateral: Secondary | ICD-10-CM | POA: Diagnosis not present

## 2023-11-12 ENCOUNTER — Ambulatory Visit: Payer: PPO | Admitting: Gastroenterology

## 2023-11-12 ENCOUNTER — Encounter: Payer: Self-pay | Admitting: Gastroenterology

## 2023-11-12 VITALS — BP 124/70 | HR 78 | Ht 64.0 in | Wt 126.0 lb

## 2023-11-12 DIAGNOSIS — K52831 Collagenous colitis: Secondary | ICD-10-CM | POA: Diagnosis not present

## 2023-11-12 DIAGNOSIS — H209 Unspecified iridocyclitis: Secondary | ICD-10-CM

## 2023-11-12 DIAGNOSIS — K529 Noninfective gastroenteritis and colitis, unspecified: Secondary | ICD-10-CM
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
## 2023-11-12 NOTE — Patient Instructions (Signed)
 You have COLLAGENOUS COLITIS - MICROSCOPIC  Call office if you have recurrent symptoms of diarrhea. Dr Wilhelmenia may consider restarting Budesonide .   We have given you a lab order. Please have labs drawn and results faxed to [phone] attn: Dr Wilhelmenia   _______________________________________________________  If your blood pressure at your visit was 140/90 or greater, please contact your primary care physician to follow up on this.  _______________________________________________________  If you are age 84 or older, your body mass index should be between 23-30. Your Body mass index is 21.63 kg/m. If this is out of the aforementioned range listed, please consider follow up with your Primary Care Provider.  If you are age 84 or younger, your body mass index should be between 19-25. Your Body mass index is 21.63 kg/m. If this is out of the aformentioned range listed, please consider follow up with your Primary Care Provider.   ________________________________________________________  The [facility] providers would like to encourage you to use MYCHART to communicate with providers for non-urgent requests or questions.  Due to long hold times on the telephone, sending your provider a message by [facility] may be a faster and more efficient way to get a response.  Please allow 48 business hours for a response.  Please remember that this is for non-urgent requests.  _______________________________________________________  Due to recent changes in healthcare laws, you may see the results of your imaging and laboratory studies on MyChart before your provider has had a chance to review them.  We understand that in some cases there may be results that are confusing or concerning to you. Not all laboratory results come back in the same time frame and the provider may be waiting for multiple results in order to interpret others.  Please give us  48 hours in order for your provider to thoroughly review  all the results before contacting the office for clarification of your results.   Thank you for choosing me and [facility].  Dr. Wilhelmenia

## 2023-11-14 ENCOUNTER — Encounter: Payer: Self-pay | Admitting: Gastroenterology

## 2023-11-14 DIAGNOSIS — K52831 Collagenous colitis: Secondary | ICD-10-CM | POA: Insufficient documentation

## 2023-11-14 DIAGNOSIS — H209 Unspecified iridocyclitis: Secondary | ICD-10-CM | POA: Insufficient documentation

## 2023-11-14 DIAGNOSIS — K529 Noninfective gastroenteritis and colitis, unspecified: Secondary | ICD-10-CM | POA: Insufficient documentation

## 2023-11-26 DIAGNOSIS — H30033 Focal chorioretinal inflammation, peripheral, bilateral: Secondary | ICD-10-CM | POA: Diagnosis not present

## 2023-12-03 DIAGNOSIS — M359 Systemic involvement of connective tissue, unspecified: Secondary | ICD-10-CM | POA: Diagnosis not present

## 2023-12-03 DIAGNOSIS — H209 Unspecified iridocyclitis: Secondary | ICD-10-CM | POA: Diagnosis not present

## 2023-12-03 DIAGNOSIS — M1990 Unspecified osteoarthritis, unspecified site: Secondary | ICD-10-CM | POA: Diagnosis not present

## 2023-12-16 DIAGNOSIS — H3581 Retinal edema: Secondary | ICD-10-CM | POA: Diagnosis not present

## 2023-12-16 DIAGNOSIS — Z961 Presence of intraocular lens: Secondary | ICD-10-CM | POA: Diagnosis not present

## 2023-12-16 DIAGNOSIS — H35373 Puckering of macula, bilateral: Secondary | ICD-10-CM | POA: Diagnosis not present

## 2023-12-16 DIAGNOSIS — H30033 Focal chorioretinal inflammation, peripheral, bilateral: Secondary | ICD-10-CM | POA: Diagnosis not present

## 2023-12-21 ENCOUNTER — Encounter: Payer: Self-pay | Admitting: Gastroenterology

## 2023-12-22 DIAGNOSIS — H35373 Puckering of macula, bilateral: Secondary | ICD-10-CM | POA: Diagnosis not present

## 2023-12-22 DIAGNOSIS — H35341 Macular cyst, hole, or pseudohole, right eye: Secondary | ICD-10-CM | POA: Diagnosis not present

## 2023-12-22 DIAGNOSIS — H353131 Nonexudative age-related macular degeneration, bilateral, early dry stage: Secondary | ICD-10-CM | POA: Diagnosis not present

## 2023-12-22 DIAGNOSIS — H30111 Disseminated chorioretinal inflammation of posterior pole, right eye: Secondary | ICD-10-CM | POA: Diagnosis not present

## 2023-12-22 DIAGNOSIS — H35363 Drusen (degenerative) of macula, bilateral: Secondary | ICD-10-CM | POA: Diagnosis not present

## 2023-12-24 DIAGNOSIS — M5416 Radiculopathy, lumbar region: Secondary | ICD-10-CM | POA: Diagnosis not present

## 2023-12-31 ENCOUNTER — Other Ambulatory Visit (HOSPITAL_COMMUNITY): Payer: Self-pay | Admitting: Neurological Surgery

## 2023-12-31 DIAGNOSIS — S32030A Wedge compression fracture of third lumbar vertebra, initial encounter for closed fracture: Secondary | ICD-10-CM

## 2024-01-04 ENCOUNTER — Ambulatory Visit (HOSPITAL_BASED_OUTPATIENT_CLINIC_OR_DEPARTMENT_OTHER)
Admission: RE | Admit: 2024-01-04 | Discharge: 2024-01-04 | Disposition: A | Discharge status: Home / Self Care | Source: Ambulatory Visit | Attending: Neurological Surgery | Admitting: Neurological Surgery

## 2024-01-04 DIAGNOSIS — S32030A Wedge compression fracture of third lumbar vertebra, initial encounter for closed fracture: Secondary | ICD-10-CM | POA: Diagnosis not present

## 2024-01-04 DIAGNOSIS — M48061 Spinal stenosis, lumbar region without neurogenic claudication: Secondary | ICD-10-CM | POA: Diagnosis not present

## 2024-01-04 DIAGNOSIS — I700 Atherosclerosis of aorta: Secondary | ICD-10-CM | POA: Diagnosis not present

## 2024-01-04 DIAGNOSIS — M5136 Other intervertebral disc degeneration, lumbar region with discogenic back pain only: Secondary | ICD-10-CM | POA: Diagnosis not present

## 2024-01-07 DIAGNOSIS — S32000K Wedge compression fracture of unspecified lumbar vertebra, subsequent encounter for fracture with nonunion: Secondary | ICD-10-CM | POA: Diagnosis not present

## 2024-01-07 DIAGNOSIS — F411 Generalized anxiety disorder: Secondary | ICD-10-CM | POA: Diagnosis not present

## 2024-01-07 DIAGNOSIS — H209 Unspecified iridocyclitis: Secondary | ICD-10-CM | POA: Diagnosis not present

## 2024-01-07 DIAGNOSIS — R631 Polydipsia: Secondary | ICD-10-CM | POA: Diagnosis not present

## 2024-01-10 DIAGNOSIS — M8008XA Age-related osteoporosis with current pathological fracture, vertebra(e), initial encounter for fracture: Secondary | ICD-10-CM | POA: Diagnosis not present

## 2024-01-19 ENCOUNTER — Other Ambulatory Visit: Payer: Self-pay

## 2024-01-19 ENCOUNTER — Observation Stay (HOSPITAL_COMMUNITY)
Admission: EM | Admit: 2024-01-19 | Discharge: 2024-01-21 | Disposition: A | Discharge status: Home / Self Care | Attending: Emergency Medicine | Admitting: Emergency Medicine

## 2024-01-19 DIAGNOSIS — I110 Hypertensive heart disease with heart failure: Secondary | ICD-10-CM | POA: Insufficient documentation

## 2024-01-19 DIAGNOSIS — I5032 Chronic diastolic (congestive) heart failure: Secondary | ICD-10-CM | POA: Diagnosis present

## 2024-01-19 DIAGNOSIS — E871 Hypo-osmolality and hyponatremia: Secondary | ICD-10-CM | POA: Diagnosis not present

## 2024-01-19 DIAGNOSIS — D72829 Elevated white blood cell count, unspecified: Secondary | ICD-10-CM | POA: Diagnosis not present

## 2024-01-19 DIAGNOSIS — D649 Anemia, unspecified: Secondary | ICD-10-CM | POA: Diagnosis not present

## 2024-01-19 DIAGNOSIS — E872 Acidosis, unspecified: Secondary | ICD-10-CM | POA: Insufficient documentation

## 2024-01-19 DIAGNOSIS — R770 Abnormality of albumin: Secondary | ICD-10-CM | POA: Diagnosis not present

## 2024-01-19 DIAGNOSIS — R531 Weakness: Secondary | ICD-10-CM

## 2024-01-19 DIAGNOSIS — E876 Hypokalemia: Secondary | ICD-10-CM | POA: Diagnosis present

## 2024-01-19 DIAGNOSIS — R55 Syncope and collapse: Secondary | ICD-10-CM

## 2024-01-19 DIAGNOSIS — R42 Dizziness and giddiness: Secondary | ICD-10-CM | POA: Diagnosis not present

## 2024-01-19 DIAGNOSIS — Z79899 Other long term (current) drug therapy: Secondary | ICD-10-CM | POA: Diagnosis not present

## 2024-01-19 DIAGNOSIS — Z7982 Long term (current) use of aspirin: Secondary | ICD-10-CM | POA: Diagnosis not present

## 2024-01-19 DIAGNOSIS — I700 Atherosclerosis of aorta: Secondary | ICD-10-CM | POA: Diagnosis not present

## 2024-01-19 DIAGNOSIS — Z8679 Personal history of other diseases of the circulatory system: Secondary | ICD-10-CM | POA: Diagnosis not present

## 2024-01-19 DIAGNOSIS — D75839 Thrombocytosis, unspecified: Secondary | ICD-10-CM | POA: Insufficient documentation

## 2024-01-19 DIAGNOSIS — Z96652 Presence of left artificial knee joint: Secondary | ICD-10-CM | POA: Diagnosis not present

## 2024-01-19 DIAGNOSIS — K5900 Constipation, unspecified: Secondary | ICD-10-CM | POA: Diagnosis not present

## 2024-01-19 LAB — CBC WITH DIFFERENTIAL/PLATELET
Abs Immature Granulocytes: 0.2 10*3/uL — ABNORMAL HIGH (ref 0.00–0.07)
Basophils Absolute: 0.1 10*3/uL (ref 0.0–0.1)
Basophils Relative: 1 %
Eosinophils Absolute: 0.5 10*3/uL (ref 0.0–0.5)
Eosinophils Relative: 5 %
HCT: 30.7 % — ABNORMAL LOW (ref 36.0–46.0)
Hemoglobin: 10.8 g/dL — ABNORMAL LOW (ref 12.0–15.0)
Immature Granulocytes: 2 %
Lymphocytes Relative: 27 %
Lymphs Abs: 2.6 10*3/uL (ref 0.7–4.0)
MCH: 29.2 pg (ref 26.0–34.0)
MCHC: 35.2 g/dL (ref 30.0–36.0)
MCV: 83 fL (ref 80.0–100.0)
Monocytes Absolute: 0.8 10*3/uL (ref 0.1–1.0)
Monocytes Relative: 8 %
Neutro Abs: 5.5 10*3/uL (ref 1.7–7.7)
Neutrophils Relative %: 57 %
Platelets: 445 10*3/uL — ABNORMAL HIGH (ref 150–400)
RBC: 3.7 MIL/uL — ABNORMAL LOW (ref 3.87–5.11)
RDW: 12.3 % (ref 11.5–15.5)
WBC: 9.6 10*3/uL (ref 4.0–10.5)
nRBC: 0 % (ref 0.0–0.2)

## 2024-01-19 NOTE — ED Provider Notes (Signed)
 [facility] EMERGENCY DEPARTMENT AT [facility] Provider Note   CSN: [id number] Arrival date & time: 01/19/24  2227     History {Add pertinent medical, surgical, social history, OB history to HPI:1} Chief Complaint  Patient presents with   Hypotension   HPI Katie Vasquez is a 83 y.o. female with complete LBBB, CHF, recent back surgery , lumbar kyphoplasty, 2 weeks ago presenting for hypotension.  States she was sitting in her recliner today around 4 PM.  She stood up and felt lightheaded.  She took her blood pressure at that time and noted that it was lower than usual.  She recorded 105/52 which is low for her.  She sat back down and her symptoms improved.  She states she feels off overall today.  She states that her mouth has been more dry than usual but she is drinking a lot of water.  States she is not lightheaded at this time.  She denies chest pain, shortness of breath, palpitations, abdominal pain.  Does report some lower back pain since her surgery but states that that is not new.  Denies fever, saddle anesthesia or urinary or bowel changes. Denies nausea, vomiting and diarrhea.   HPI     Home Medications Prior to Admission medications   Medication Sig Start Date End Date Taking? Authorizing Provider  aspirin EC 81 MG tablet Take 81 mg by mouth daily. Swallow whole.    [provider]  budesonide (ENTOCORT EC) 3 MG 24 hr capsule TAKE 3 CAPSULES (9 MG TOTAL) BY MOUTH DAILY. Patient not taking: Reported on 11/12/2023 07/29/23   Mansouraty, Netty Starring., MD  chlorthalidone (HYGROTON) 25 MG tablet Take 25 mg by mouth every morning.    [provider]  losartan (COZAAR) 25 MG tablet TAKE 1 TABLET (25 MG TOTAL) BY MOUTH DAILY. 02/15/23   Croitoru, Mihai, MD  meloxicam (MOBIC) 7.5 MG tablet Take 7.5 mg by mouth daily as needed. Patient not taking: Reported on 11/12/2023 01/29/23   [provider]  mesalamine (LIALDA) 1.2 g EC tablet Take 4 tablets (4.8 g  total) by mouth daily with breakfast. 08/02/23 09/01/23  Mansouraty, Netty Starring., MD  Multiple Vitamin (MULTI VITAMIN) TABS Take 1 tablet by mouth daily at 6 (six) AM. Patient not taking: Reported on 11/12/2023    [provider]  Multiple Vitamins-Minerals (MULTIVITAMIN WITH MINERALS) tablet Take 1 tablet by mouth daily. Spectra Vite Patient not taking: Reported on 06/08/2023    [provider]  Omega-3 Fatty Acids (FISH OIL) 1000 MG CAPS Take 1,000 mg by mouth daily at 6 (six) AM. Patient not taking: Reported on 11/12/2023    [provider]  potassium chloride (MICRO-K) 10 MEQ CR capsule Take 10 mEq by mouth at bedtime.    [provider]  spironolactone (ALDACTONE) 25 MG tablet Take 25 mg by mouth every morning.    [provider]  tretinoin (RETIN-A) 0.025 % cream Apply 0.025 % topically at bedtime. Patient not taking: Reported on 11/12/2023 06/12/23   [provider]      Allergies    Entresto [sacubitril-valsartan], Other, and Keflex [cephalexin]    Review of Systems   See HPI  Physical Exam Updated Vital Signs BP (!) 177/70 (BP Location: Left Arm)   Pulse 74   Temp 97.7 F (36.5 C) (Oral)   Resp 16   Ht 5\' 6"  (1.676 m)   Wt 56.2 kg   SpO2 97%   BMI 20.01 kg/m  Physical  Exam Vitals and nursing note reviewed.  HENT:     Head: Normocephalic and atraumatic.     Mouth/Throat:     Mouth: Mucous membranes are moist.  Eyes:     General:        Right eye: No discharge.        Left eye: No discharge.     Conjunctiva/sclera: Conjunctivae normal.  Cardiovascular:     Rate and Rhythm: Normal rate and regular rhythm.     Pulses: Normal pulses.     Heart sounds: Normal heart sounds.  Pulmonary:     Effort: Pulmonary effort is normal.     Breath sounds: Normal breath sounds.  Abdominal:     General: Abdomen is flat.     Palpations: Abdomen is soft.  Skin:    General: Skin is warm and dry.  Neurological:     General: No focal  deficit present.  Psychiatric:        Mood and Affect: Mood normal.     ED Results / Procedures / Treatments   Labs (all labs ordered are listed, but only abnormal results are displayed) Labs Reviewed - No data to display  EKG EKG Interpretation Date/Time:  Wednesday January 19 2024 22:29:58 EDT Ventricular Rate:  72 PR Interval:  166 QRS Duration:  153 QT Interval:  418 QTC Calculation: 458 R Axis:   -55  Text Interpretation: Sinus rhythm Probable left atrial enlargement Left bundle branch block no sig change from previous Confirmed by Wynetta Heckle [phone]) on 01/19/2024 10:38:18 PM  Radiology No results found.  Procedures Procedures  {Document cardiac monitor, telemetry assessment procedure when appropriate:1}  Medications Ordered in ED Medications - No data to display  ED Course/ Medical Decision Making/ A&P   {   Click here for ABCD2, HEART and other calculatorsREFRESH Note before signing :1}                              Medical Decision Making  ***  {Document critical care time when appropriate:1} {Document review of labs and clinical decision tools ie heart score, Chads2Vasc2 etc:1}  {Document your independent review of radiology images, and any outside records:1} {Document your discussion with family members, caretakers, and with consultants:1} {Document social determinants of health affecting pt's care:1} {Document your decision making why or why not admission, treatments were needed:1} Final Clinical Impression(s) / ED Diagnoses Final diagnoses:  None    Rx / DC Orders ED Discharge Orders     None

## 2024-01-19 NOTE — ED Triage Notes (Signed)
 As per EMS< patient had surgery to back 2 weeks PTA, has felt "off" since then. Patient states she developed hypotension this afternoon, normotensive en route, hypertensive @ triage.

## 2024-01-20 ENCOUNTER — Encounter (HOSPITAL_COMMUNITY): Payer: Self-pay | Admitting: Internal Medicine

## 2024-01-20 ENCOUNTER — Inpatient Hospital Stay (HOSPITAL_COMMUNITY)

## 2024-01-20 DIAGNOSIS — D649 Anemia, unspecified: Secondary | ICD-10-CM | POA: Diagnosis not present

## 2024-01-20 DIAGNOSIS — E876 Hypokalemia: Secondary | ICD-10-CM

## 2024-01-20 DIAGNOSIS — E871 Hypo-osmolality and hyponatremia: Secondary | ICD-10-CM | POA: Diagnosis not present

## 2024-01-20 DIAGNOSIS — I5032 Chronic diastolic (congestive) heart failure: Secondary | ICD-10-CM | POA: Diagnosis not present

## 2024-01-20 DIAGNOSIS — R55 Syncope and collapse: Secondary | ICD-10-CM

## 2024-01-20 DIAGNOSIS — R42 Dizziness and giddiness: Secondary | ICD-10-CM

## 2024-01-20 DIAGNOSIS — Z8679 Personal history of other diseases of the circulatory system: Secondary | ICD-10-CM

## 2024-01-20 DIAGNOSIS — R531 Weakness: Secondary | ICD-10-CM | POA: Diagnosis not present

## 2024-01-20 LAB — COMPREHENSIVE METABOLIC PANEL WITH GFR
ALT: 12 U/L (ref 0–44)
ALT: 13 U/L (ref 0–44)
AST: 17 U/L (ref 15–41)
AST: 17 U/L (ref 15–41)
Albumin: 3.3 g/dL — ABNORMAL LOW (ref 3.5–5.0)
Albumin: 3.4 g/dL — ABNORMAL LOW (ref 3.5–5.0)
Alkaline Phosphatase: 72 U/L (ref 38–126)
Alkaline Phosphatase: 74 U/L (ref 38–126)
Anion gap: 11 (ref 5–15)
Anion gap: 10 (ref 5–15)
BUN: 17 mg/dL (ref 8–23)
BUN: 15 mg/dL (ref 8–23)
CO2: 21 mmol/L — ABNORMAL LOW (ref 22–32)
CO2: 22 mmol/L (ref 22–32)
Calcium: 8.6 mg/dL — ABNORMAL LOW (ref 8.9–10.3)
Calcium: 9 mg/dL (ref 8.9–10.3)
Chloride: 91 mmol/L — ABNORMAL LOW (ref 98–111)
Chloride: 96 mmol/L — ABNORMAL LOW (ref 98–111)
Creatinine, Ser: 0.97 mg/dL (ref 0.44–1.00)
Creatinine, Ser: 1.21 mg/dL — ABNORMAL HIGH (ref 0.44–1.00)
GFR, Estimated: 58 mL/min — ABNORMAL LOW (ref >60)
GFR, Estimated: 44 mL/min — ABNORMAL LOW (ref >60)
Glucose, Bld: 100 mg/dL — ABNORMAL HIGH (ref 70–99)
Glucose, Bld: 126 mg/dL — ABNORMAL HIGH (ref 70–99)
Potassium: 3.4 mmol/L — ABNORMAL LOW (ref 3.5–5.1)
Potassium: 3.8 mmol/L (ref 3.5–5.1)
Sodium: 123 mmol/L — ABNORMAL LOW (ref 135–145)
Sodium: 128 mmol/L — ABNORMAL LOW (ref 135–145)
Total Bilirubin: 0.5 mg/dL (ref 0.0–1.2)
Total Bilirubin: 0.6 mg/dL (ref 0.0–1.2)
Total Protein: 5.4 g/dL — ABNORMAL LOW (ref 6.5–8.1)
Total Protein: 5.7 g/dL — ABNORMAL LOW (ref 6.5–8.1)

## 2024-01-20 LAB — CBC WITH DIFFERENTIAL/PLATELET
Abs Immature Granulocytes: 0.17 10*3/uL — ABNORMAL HIGH (ref 0.00–0.07)
Basophils Absolute: 0.1 10*3/uL (ref 0.0–0.1)
Basophils Relative: 1 %
Eosinophils Absolute: 0.5 10*3/uL (ref 0.0–0.5)
Eosinophils Relative: 5 %
HCT: 33.6 % — ABNORMAL LOW (ref 36.0–46.0)
Hemoglobin: 11.4 g/dL — ABNORMAL LOW (ref 12.0–15.0)
Immature Granulocytes: 2 %
Lymphocytes Relative: 21 %
Lymphs Abs: 2.1 10*3/uL (ref 0.7–4.0)
MCH: 28.8 pg (ref 26.0–34.0)
MCHC: 33.9 g/dL (ref 30.0–36.0)
MCV: 84.8 fL (ref 80.0–100.0)
Monocytes Absolute: 0.9 10*3/uL (ref 0.1–1.0)
Monocytes Relative: 9 %
Neutro Abs: 6.4 10*3/uL (ref 1.7–7.7)
Neutrophils Relative %: 62 %
Platelets: 451 10*3/uL — ABNORMAL HIGH (ref 150–400)
RBC: 3.96 MIL/uL (ref 3.87–5.11)
RDW: 12.4 % (ref 11.5–15.5)
WBC: 10.1 10*3/uL (ref 4.0–10.5)
nRBC: 0 % (ref 0.0–0.2)

## 2024-01-20 LAB — POC OCCULT BLOOD, ED: Fecal Occult Bld: NEGATIVE

## 2024-01-20 LAB — APTT: aPTT: 29 s (ref 24–36)

## 2024-01-20 LAB — SODIUM, URINE, RANDOM: Sodium, Ur: 54 mmol/L

## 2024-01-20 LAB — TYPE AND SCREEN
ABO/RH(D): B POS
Antibody Screen: NEGATIVE

## 2024-01-20 LAB — IRON AND TIBC
Iron: 46 ug/dL (ref 28–170)
Saturation Ratios: 15 % (ref 10.4–31.8)
TIBC: 311 ug/dL (ref 250–450)
UIBC: 265 ug/dL

## 2024-01-20 LAB — PROTIME-INR
INR: 1.1 (ref 0.8–1.2)
Prothrombin Time: 14.6 s (ref 11.4–15.2)

## 2024-01-20 LAB — HEMOGLOBIN AND HEMATOCRIT, BLOOD
HCT: 32.9 % — ABNORMAL LOW (ref 36.0–46.0)
Hemoglobin: 11.5 g/dL — ABNORMAL LOW (ref 12.0–15.0)

## 2024-01-20 LAB — MAGNESIUM: Magnesium: 1.8 mg/dL (ref 1.7–2.4)

## 2024-01-20 LAB — CREATININE, URINE, RANDOM: Creatinine, Urine: 20 mg/dL

## 2024-01-20 LAB — FOLATE: Folate: 20.7 ng/mL (ref >5.9)

## 2024-01-20 LAB — FERRITIN: Ferritin: 127 ng/mL (ref 11–307)

## 2024-01-20 LAB — OSMOLALITY, URINE: Osmolality, Ur: 213 mosm/kg — ABNORMAL LOW (ref 300–900)

## 2024-01-20 LAB — URIC ACID: Uric Acid, Serum: 4.8 mg/dL (ref 2.5–7.1)

## 2024-01-20 LAB — VITAMIN B12: Vitamin B-12: 477 pg/mL (ref 180–914)

## 2024-01-20 LAB — TSH: TSH: 1.78 u[IU]/mL (ref 0.350–4.500)

## 2024-01-20 LAB — BRAIN NATRIURETIC PEPTIDE: B Natriuretic Peptide: 62.1 pg/mL (ref 0.0–100.0)

## 2024-01-20 LAB — OSMOLALITY: Osmolality: 273 mosm/kg — ABNORMAL LOW (ref 275–295)

## 2024-01-20 MED ORDER — SODIUM CHLORIDE 0.9 % IV SOLN: INTRAVENOUS | Status: DC

## 2024-01-20 MED ORDER — ACETAMINOPHEN 650 MG RE SUPP: 650.0000 mg | Freq: Four times a day (QID) | RECTAL | Status: DC | PRN

## 2024-01-20 MED ORDER — ORAL CARE MOUTH RINSE: 15.0000 mL | OROMUCOSAL | Status: DC | PRN

## 2024-01-20 MED ORDER — HYDRALAZINE HCL 20 MG/ML IJ SOLN
10.0000 mg | Freq: Four times a day (QID) | INTRAMUSCULAR | Status: DC | PRN
  Administered 2024-01-21: 10 mg via INTRAVENOUS
  Filled 2024-01-20: qty 1

## 2024-01-20 MED ORDER — ACETAMINOPHEN 325 MG PO TABS: 650.0000 mg | ORAL_TABLET | Freq: Four times a day (QID) | ORAL | Status: DC | PRN

## 2024-01-20 MED ORDER — HYDROCODONE-ACETAMINOPHEN 5-325 MG PO TABS
1.0000 | ORAL_TABLET | Freq: Four times a day (QID) | ORAL | Status: DC | PRN
  Administered 2024-01-20 (×2): 1 via ORAL
  Filled 2024-01-20 (×2): qty 1

## 2024-01-20 MED ORDER — POTASSIUM CHLORIDE CRYS ER 20 MEQ PO TBCR
40.0000 meq | EXTENDED_RELEASE_TABLET | Freq: Once | ORAL | Status: AC
  Administered 2024-01-20: 40 meq via ORAL
  Filled 2024-01-20: qty 2

## 2024-01-20 MED ORDER — MELATONIN 3 MG PO TABS: 3.0000 mg | ORAL_TABLET | Freq: Every evening | ORAL | Status: DC | PRN

## 2024-01-20 MED ORDER — ONDANSETRON HCL 4 MG/2ML IJ SOLN
4.0000 mg | Freq: Four times a day (QID) | INTRAMUSCULAR | Status: DC | PRN
  Administered 2024-01-20: 4 mg via INTRAVENOUS
  Filled 2024-01-20: qty 2

## 2024-01-20 MED ORDER — ASPIRIN 81 MG PO TBEC
81.0000 mg | DELAYED_RELEASE_TABLET | Freq: Every day | ORAL | Status: DC
  Administered 2024-01-20: 81 mg via ORAL
  Filled 2024-01-20 (×2): qty 1

## 2024-01-20 MED ORDER — LOSARTAN POTASSIUM 25 MG PO TABS
25.0000 mg | ORAL_TABLET | Freq: Every day | ORAL | Status: DC
  Administered 2024-01-20: 25 mg via ORAL
  Filled 2024-01-20 (×2): qty 1

## 2024-01-20 MED ORDER — SODIUM CHLORIDE 0.9 % IV BOLUS
1000.0000 mL | Freq: Once | INTRAVENOUS | Status: AC
  Administered 2024-01-20: 1000 mL via INTRAVENOUS

## 2024-01-20 MED ORDER — NALOXONE HCL 0.4 MG/ML IJ SOLN: 0.4000 mg | INTRAMUSCULAR | Status: DC | PRN

## 2024-01-20 NOTE — ED Notes (Signed)
 Patient ambulated twice to restroom with no difficulties. MD made aware.

## 2024-01-20 NOTE — ED Provider Notes (Signed)
 83 year old female with history of hypertension and recent kyphoplasty had an episode of dizziness today.  No chest pain or dyspnea.  She is noted to be tachycardic in the ED, was hypotensive at home but not in the ED.  Laboratory workup shows new onset hyponatremia and new onset anemia with hemoglobin having dropped 1.7 g compared with 11/26/2023.  Both are potential causes for her dizziness.  She will need to be admitted for further workup.   Alissa April, MD 01/21/24 [phone]

## 2024-01-20 NOTE — Progress Notes (Signed)
 Pt admitted to [facility] from ED, had recent surgery on her back, there are 2 small healed areas lower back, she states the right one hurts more than the left. Bilateral feet cool to touch but pulses good 2+.

## 2024-01-20 NOTE — Hospital Course (Addendum)
 The patient is an 83 year old Caucasian female with a past medical history significant for essential hypertension, chronic diastolic CHF, history of anemia, degenerative disc disease, history of arthritis and back pain along with a history of vertigo who presented to the hospital with dizziness from home.  Recently she underwent lumbar kyphoplasty and since then she has not been feeling very well and has been complaining of dizziness.  She had a 1 day history of intermittent dizziness and lightheadedness when she was rising from a sitting standing position without loss of consciousness.  Further workup was done and on admission she was found to have hyponatremia.  Currently she is being admitted and evaluated for her hyponatremia, generalized weakness, hypokalemia and dizziness with presyncopal episode.  Symptoms resolved and her sodium trended back up.  She was given IV fluid hydration and PT OT evaluated and she was not orthostatic.  She is deemed medically stable for discharge at this time and will need follow-up with PCP.  Assessment and Plan:  Acute Hypo-osmolar Hyponatremia: Na+ was 123 and improved to 132. Differential includes potential SIADH, including potential contribution from postoperative SIADH in the context of recently completed lumbar kyphoplasty, with potential pharmacologic contribution from outpatient chlorthalidone , particularly noting her concomitant hypochloremia.  Given a 1 L bolus of NS x1. Start mIVF with NS @ 75 mL/hr. Urine Osm was 213. Urine Na+ was 54 and Urine Cr was 20. TSH was 1.780. UA never done. Strict I's and O's and Daily weights. Uric Acid Level was 4.8; CXR Negative.  Hold chlorthalidone  and recommend regular diet.  Follow-up with PCP within 1 week given that it is much improved and closer to baseline.  Hypokalemia: K+ is 3.8. CTM and Trend and Replete as Necessary. Repeat CMP in the AM  Leukocytosis: Mild and Likely reactive. Took her Budesonide . No S/Sx of Infection.  WBC went from 9.6 -> 10.1 -> 11.0. CTM and Trend and repeat CBC w/in 1 week   Metabolic Acidosis: Mild. Has a CO2 of 21, AG of 11, Chloride Level of 100. CTM and Trend and repeat CMP in the AM  Hx of Chronic Diastolic CHF: Recent ECHO done in January 2024 and showed an LVEF of 50-55% and G1DD. BNP was 62.1. CTM for S/Sx of Volume overload. Strict I's and O's and daily weights.   Presyncope: Had an episode of dizziness and Lightheadedness and was in the setting of when she went from a seated to standing position. Given 1 Liter of NS bolus. Start IVF with mIVF with NS @ 75 mL/hr. Fall precautions. Check Orthostatics. PT/OT to evaluate and treat. Hold Chlorthalidone  and Spironolactone  for now and resume spironolactone  discharge but continue to hold chlorthalidone .   Generalized Weakness: Had 3-4 days of Generalized Weakness in the absence of evidence of acute focal neurologic deficits. W/U as delineated and PT OT recommending no follow-up  Essential HTN: Holding Spironolactone , Chlorthalidone . Resume Losartan  25 mg po daily. CTM BP per Protocol. Last BP reading was elevated at 162/85. Will add IV Hydralazine  10 mg q6hprn for SBP >170 or DBP >100  Recent Lumbar Kyphoplasty: C/w Pain Control w/ Hydrocodone -Acetaminophen  1 tab po q6hprn Moderate Pain   Normocytic Anemia: Hgb/Hct went from 10.8/30.7 -> 11.4/33.6 -> 11.5/32.9 -> 11.6/32.5. Checked Anemia Panel showed an iron level of 46, UIBC of 265, TIBC of 311, saturation ration of 15%, ferritin of 127, vitamin B12 477. CTM for S/Sx of Bleeding; No overt bleeding noted. FOBT negative. Repeat CBC i within 1 week  Thrombocytosis: Mild and  likely reactive. Plt Count went from 445 -> 451 -> 510. CTM and Trend and repeat CBC w/in 1 week  Hypoalbuminemia: Patient's Albumin is now 3.4. CTM and Trend and repeat CMP in the AM

## 2024-01-20 NOTE — Progress Notes (Signed)
 PROGRESS NOTE    Katie Vasquez  ONG:[id number] DOB: 1941-08-23 DOA: 01/19/2024 PCP: Thana Ates, MD   Brief Narrative:  The patient is an 83 year old Caucasian female with a past medical history significant for essential hypertension, chronic diastolic CHF, history of anemia, degenerative disc disease, history of arthritis and back pain along with a history of vertigo who presented to the hospital with dizziness from home.  Recently she underwent lumbar kyphoplasty and since then she has not been feeling very well and has been complaining of dizziness.  She had a 1 day history of intermittent dizziness and lightheadedness when she was rising from a sitting standing position without loss of consciousness.  Further workup was done and on admission she was found to have hyponatremia.  Currently she is being admitted and evaluated for her hyponatremia, generalized weakness, hypokalemia and dizziness with presyncopal episode.  Assessment and Plan:  Acute Hypo-osmolar Hyponatremia: Na+ was 123. Differential includes potential SIADH, including potential contribution from postoperative SIADH in the context of recently completed lumbar kyphoplasty, with potential pharmacologic contribution from outpatient chlorthalidone, particularly noting her concomitant hypochloremia.  Given a 1 L bolus of NS x1. Start mIVF with NS @ 75 mL/hr. Urine Osm was 213. Urine Na+ was 54 and Urine Cr was 20. TSH was 1.780. UA never done. Strict I's and O's and Daily weights. Uric Acid Level was 4.8; CXR Negative. Unfortunately Na+ has not been repeated despite being ordered.  Hypokalemia: K+ is 3.4. Replete with po KCL 40 mEQ x1. CTM and Trend and Replete as Necessary. Repeat CMP in the AM  Metabolic Acidosis: Mild. Has a CO2 of 21, AG of 11, Chloride Level of 91. CTM and Trend and repeat CMP in the AM  Hx of Chronic Diastolic CHF: Recent ECHO done in January 2024 and showed an LVEF of 50-55% and G1DD. BNP was 62.1. CTM for S/Sx  of Volume overload. Strict I's and O's and daily weights.   Presyncope: Had an episode of dizziness and Lightheadedness and was in the setting of when she went from a seated to standing position. Given 1 Liter of NS bolus. Start IVF with mIVF with NS @ 75 mL/hr. Fall precautions. Check Orthostatics. PT/OT to evaluate and treat. Hold Chlorthalidone and Spironolactone for now.   Generalized Weakness: Had 3-4 days of Generalized Weakness in the absence of evidence of acute focal neurologic deficits. W/U as delineated.   Essential HTN: Holding Spironolactone, Chlorthalidone. Resume Losartan 25 mg po daily. CTM BP per Protocol. Last BP reading was elevated at 183/78. Will add IV Hydralazine 10 mg q6hprn for SBP >170 or DBP >100  Recent Lumbar Kyphoplasty: C/w Pain Control w/ Hydrocodone-Acetaminophen 1 tab po q6hprn Moderate Pain   Normocytic Anemia: Hgb/Hct went from 10.8/30.7 -> 11.4/33.6. Check Anemia Panel in the AM. CTM for S/Sx of Bleeding; No overt bleeding noted. FOBT negative. Repeat CBC in the AM  Thrombocytosis: Mild and likely reactive. Plt Count went from 445 -> 451. CTM and Trend and repeat CBC in the AM.   Hypoalbuminemia: Patient's Albumin is now 3.3. CTM and Trend and repeat CMP in the AM   DVT prophylaxis: SCDs Start: 01/20/24 0149    Code Status: Full Code Family Communication: No family present at bedside   Disposition Plan:  Level of care: Telemetry Medical Status is: Inpatient Remains inpatient appropriate because: Needs further improvement in her Na+   Consultants:  None  Procedures:  As delineated as above  Antimicrobials:  Anti-infectives (From admission, onward)  None       Objective: Vitals:   01/20/24 1130 01/20/24 1145 01/20/24 1152 01/20/24 1445  BP: (!) 180/77 (!) 171/76  (!) 183/78  Pulse: 72 72  83  Resp: 14 14  17   Temp:   97.8 F (36.6 C)   TempSrc:   Oral   SpO2: 99% 100%  100%  Weight:      Height:        Intake/Output Summary  (Last 24 hours) at 01/20/2024 1520 Last data filed at 01/20/2024 0152 Gross per 24 hour  Intake 480 ml  Output --  Net 480 ml   Filed Weights   01/19/24 2233  Weight: 56.2 kg   Data Reviewed: I have personally reviewed following labs and imaging studies  CBC: Recent Labs  Lab 01/19/24 2320 01/20/24 0737  WBC 9.6 10.1  NEUTROABS 5.5 6.4  HGB 10.8* 11.4*  HCT 30.7* 33.6*  MCV 83.0 84.8  PLT 445* 451*   Basic Metabolic Panel: Recent Labs  Lab 01/19/24 2320  NA 123*  K 3.4*  CL 91*  CO2 21*  GLUCOSE 100*  BUN 17  CREATININE 0.97  CALCIUM 8.6*   GFR: Estimated Creatinine Clearance: 39 mL/min (by C-G formula based on SCr of 0.97 mg/dL). Liver Function Tests: Recent Labs  Lab 01/19/24 2320  AST 17  ALT 12  ALKPHOS 72  BILITOT 0.5  PROT 5.4*  ALBUMIN 3.3*   No results for input(s): "LIPASE", "AMYLASE" in the last 168 hours. No results for input(s): "AMMONIA" in the last 168 hours. Coagulation Profile: No results for input(s): "INR", "PROTIME" in the last 168 hours. Cardiac Enzymes: No results for input(s): "CKTOTAL", "CKMB", "CKMBINDEX", "TROPONINI" in the last 168 hours. BNP (last 3 results) No results for input(s): "PROBNP" in the last 8760 hours. HbA1C: No results for input(s): "HGBA1C" in the last 72 hours. CBG: No results for input(s): "GLUCAP" in the last 168 hours. Lipid Profile: No results for input(s): "CHOL", "HDL", "LDLCALC", "TRIG", "CHOLHDL", "LDLDIRECT" in the last 72 hours. Thyroid Function Tests: Recent Labs    01/20/24 0737  TSH 1.780   Anemia Panel: No results for input(s): "VITAMINB12", "FOLATE", "FERRITIN", "TIBC", "IRON", "RETICCTPCT" in the last 72 hours. Sepsis Labs: No results for input(s): "PROCALCITON", "LATICACIDVEN" in the last 168 hours.  No results found for this or any previous visit (from the past 240 hours).   Radiology Studies: DG Chest Port 1 View Result Date: 01/20/2024 CLINICAL DATA:  Hyponatremia EXAM:  PORTABLE CHEST 1 VIEW COMPARISON:  12/10/2017 FINDINGS: Cardiac shadow is stable. Aortic calcifications are noted. The lungs are clear bilaterally. No bony abnormality is noted. IMPRESSION: No acute abnormality noted. Electronically Signed   By: Violeta Grey M.D.   On: 01/20/2024 02:58   Scheduled Meds:  aspirin EC  81 mg Oral Daily   losartan  25 mg Oral Daily   Continuous Infusions:  sodium chloride      LOS: 0 days   Aura Leeds, DO [facility] Available via Epic secure chat 7am-7pm After these hours, please refer to coverage provider listed on [email or web address] 01/20/2024, 3:20 PM

## 2024-01-20 NOTE — Progress Notes (Signed)
 Physical Therapy Brief Evaluation and Discharge Note Patient Details Name: Katie Vasquez MRN: [id number] DOB: 01/18/1941 Today's Date: 01/20/2024   History of Present Illness  83 y.o. female presented to [facility] ED on 01/19/2024 with acute hyponatremia and dizziness. Labs revealed hyponatremia and hypokalemia. Admitted for treatment of Acute hypo-osmolar hyponatremia,  Acute normocytic anemia, and hypokalemia.  NWG:[id number] back surgery. essential hypertension, chronic diastolic heart failure.  Clinical Impression  PTA pt living in multistory home (does not go upstairs) with 3 steps to enter. Pt reports independence with ambulation, ADLs and iADLs. Pt currently has some surgical site back pain and stiffness, but reports it feels better with walking. Noted to have increased R knee flexion in ambulation, but does not effect the safety of her gait. Pt has no further PT needs. PT signing off.        PT Assessment Patient does not need any further PT services  Assistance Needed at Discharge  PRN    Equipment Recommendations None recommended by PT     Precautions/Restrictions Precautions Precautions: Back Restrictions Weight Bearing Restrictions Per Provider Order: No        Mobility  Bed Mobility   Supine/Sidelying to sit: Independent   General bed mobility comments: head of stretcher maximally elevated, discussed reduced twisting with exiting bed to decreased twist in spine in light of recent back surgery and increased back pain  Transfers Overall transfer level: Independent Equipment used: None               General transfer comment: good power up and self steadying    Ambulation/Gait Ambulation/Gait assistance: Independent Gait Distance (Feet): 250 Feet Assistive device: None Gait Pattern/deviations: Step-through pattern, Knee flexed in stance - right Gait Speed: Pace WFL General Gait Details: independent with gait, lacks full R knee extension but overall gait is  steady         Balance Overall balance assessment: Mild deficits observed, not formally tested                        Pertinent Vitals/Pain PT - Brief Vital Signs All Vital Signs Stable: Yes Pain Assessment Pain Assessment: 0-10 Pain Score: 7  Pain Location: back surgical site Pain Descriptors / Indicators: Operative site guarding, Aching Pain Intervention(s): Limited activity within patient's tolerance, Monitored during session, Repositioned     Home Living Family/patient expects to be discharged to:: Private residence Living Arrangements: Spouse/significant other Available Help at Discharge: Available 24 hours/day Home Environment: Stairs to enter  [employer] of Steps: 3 Home Equipment: Grab bars - tub/shower;Rolling Walker (2 wheels);Cane - single point (grouted towel bar)        Prior Function Level of Independence: Independent      UE/LE Assessment   UE ROM/Strength/Tone/Coordination: WFL    LE ROM/Strength/Tone/Coordination: Impaired LE ROM/Strength/Tone/Coordination Deficits: hx R RKA, lacks full extension with gait, does not effect overall safety of gait    Communication   Communication Communication: No apparent difficulties     Cognition Overall Cognitive Status: Appears within functional limits for tasks assessed/performed              Assessment/Plan       PT Visit Diagnosis Pain    No Skilled PT All education completed;Patient at baseline level of functioning;Patient is independent with all acitivity/mobility;Patient will have necessary level of assist by caregiver at discharge    AMPAC 6 Clicks Help needed turning from your back to your side while in a flat bed  without using bedrails?: None Help needed moving from lying on your back to sitting on the side of a flat bed without using bedrails?: None Help needed moving to and from a bed to a chair (including a wheelchair)?: None Help needed standing up from a chair using your  arms (e.g., wheelchair or bedside chair)?: None Help needed to walk in hospital room?: None Help needed climbing 3-5 steps with a railing? : None 6 Click Score: 24      End of Session Equipment Utilized During Treatment: Gait belt Activity Tolerance: Patient tolerated treatment well Patient left: in bed Nurse Communication: Mobility status;Other (comment) (asking for drink) PT Visit Diagnosis: Pain Pain - part of body:  (back)     Time: 1324-4010 PT Time Calculation (min) (ACUTE ONLY): 27 min  Charges:   PT Evaluation $PT Eval Low Complexity: 1 Low PT Treatments $Therapeutic Exercise: 8-22 mins    Wisdom Seybold B. Jewel Mortimer PT, DPT Acute Rehabilitation Services Please use secure chat or  Call Office [phone]   Katie Vasquez [facility]  01/20/2024, 11:45 AM
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
## 2024-01-21 DIAGNOSIS — R55 Syncope and collapse: Secondary | ICD-10-CM | POA: Diagnosis not present

## 2024-01-21 DIAGNOSIS — D649 Anemia, unspecified: Secondary | ICD-10-CM | POA: Diagnosis not present

## 2024-01-21 DIAGNOSIS — E876 Hypokalemia: Secondary | ICD-10-CM | POA: Diagnosis not present

## 2024-01-21 DIAGNOSIS — R531 Weakness: Secondary | ICD-10-CM | POA: Diagnosis not present

## 2024-01-21 DIAGNOSIS — R42 Dizziness and giddiness: Secondary | ICD-10-CM | POA: Diagnosis not present

## 2024-01-21 DIAGNOSIS — Z8679 Personal history of other diseases of the circulatory system: Secondary | ICD-10-CM | POA: Diagnosis not present

## 2024-01-21 DIAGNOSIS — E871 Hypo-osmolality and hyponatremia: Secondary | ICD-10-CM | POA: Diagnosis not present

## 2024-01-21 DIAGNOSIS — I5032 Chronic diastolic (congestive) heart failure: Secondary | ICD-10-CM | POA: Diagnosis not present

## 2024-01-21 LAB — COMPREHENSIVE METABOLIC PANEL WITH GFR
ALT: 12 U/L (ref 0–44)
AST: 17 U/L (ref 15–41)
Albumin: 3.4 g/dL — ABNORMAL LOW (ref 3.5–5.0)
Alkaline Phosphatase: 67 U/L (ref 38–126)
Anion gap: 11 (ref 5–15)
BUN: 12 mg/dL (ref 8–23)
CO2: 21 mmol/L — ABNORMAL LOW (ref 22–32)
Calcium: 9.1 mg/dL (ref 8.9–10.3)
Chloride: 100 mmol/L (ref 98–111)
Creatinine, Ser: 0.95 mg/dL (ref 0.44–1.00)
GFR, Estimated: 59 mL/min — ABNORMAL LOW (ref >60)
Glucose, Bld: 87 mg/dL (ref 70–99)
Potassium: 3.8 mmol/L (ref 3.5–5.1)
Sodium: 132 mmol/L — ABNORMAL LOW (ref 135–145)
Total Bilirubin: 0.5 mg/dL (ref 0.0–1.2)
Total Protein: 5.8 g/dL — ABNORMAL LOW (ref 6.5–8.1)

## 2024-01-21 LAB — CBC WITH DIFFERENTIAL/PLATELET
Abs Immature Granulocytes: 0.13 10*3/uL — ABNORMAL HIGH (ref 0.00–0.07)
Basophils Absolute: 0.1 10*3/uL (ref 0.0–0.1)
Basophils Relative: 1 %
Eosinophils Absolute: 0.4 10*3/uL (ref 0.0–0.5)
Eosinophils Relative: 3 %
HCT: 32.5 % — ABNORMAL LOW (ref 36.0–46.0)
Hemoglobin: 11.6 g/dL — ABNORMAL LOW (ref 12.0–15.0)
Immature Granulocytes: 1 %
Lymphocytes Relative: 18 %
Lymphs Abs: 2 10*3/uL (ref 0.7–4.0)
MCH: 29.7 pg (ref 26.0–34.0)
MCHC: 35.7 g/dL (ref 30.0–36.0)
MCV: 83.3 fL (ref 80.0–100.0)
Monocytes Absolute: 0.9 10*3/uL (ref 0.1–1.0)
Monocytes Relative: 8 %
Neutro Abs: 7.6 10*3/uL (ref 1.7–7.7)
Neutrophils Relative %: 69 %
Platelets: 510 10*3/uL — ABNORMAL HIGH (ref 150–400)
RBC: 3.9 MIL/uL (ref 3.87–5.11)
RDW: 12.7 % (ref 11.5–15.5)
WBC: 11 10*3/uL — ABNORMAL HIGH (ref 4.0–10.5)
nRBC: 0 % (ref 0.0–0.2)

## 2024-01-21 LAB — MAGNESIUM: Magnesium: 1.9 mg/dL (ref 1.7–2.4)

## 2024-01-21 LAB — PHOSPHORUS: Phosphorus: 3.1 mg/dL (ref 2.5–4.6)

## 2024-01-21 MED ORDER — ACETAMINOPHEN 325 MG PO TABS: 650.0000 mg | ORAL_TABLET | Freq: Four times a day (QID) | ORAL | 0 refills | Status: DC | PRN

## 2024-01-21 MED ORDER — BUDESONIDE 3 MG PO CPEP
9.0000 mg | ORAL_CAPSULE | Freq: Every day | ORAL | Status: DC
  Filled 2024-01-21: qty 3

## 2024-01-21 NOTE — Progress Notes (Signed)
 Mobility Specialist Progress Note:   01/21/24 1149  Mobility  Activity Ambulated independently in hallway  Level of Assistance Independent  Assistive Device None  Distance Ambulated (ft) 550 ft  Activity Response Tolerated well  Mobility Referral Yes  Mobility visit 1 Mobility  Mobility Specialist Start Time (ACUTE ONLY) 1138  Mobility Specialist Stop Time (ACUTE ONLY) 1145  Mobility Specialist Time Calculation (min) (ACUTE ONLY) 7 min   During Mobility: 92 HR , 96% SpO2 RA Post Mobility: 87 HR , 98% SpO2 RA  Pt received ambulating in hallway, agreeable for MS to join. No physical assist needed during ambulation as pt is independent. C/o slight SOB during ambulation, otherwise asx throughout. Pt denied any dizziness during session. VSS. Pt left in chair with call bell in reach and all needs met.   Katie Vasquez  Mobility Specialist Please contact via [employer] office at [phone]

## 2024-01-21 NOTE — Care Management Obs Status (Signed)
 MEDICARE OBSERVATION STATUS NOTIFICATION   Patient Details  Name: Katie Vasquez MRN: [id number] Date of Birth: 08-11-1941   Medicare Observation Status Notification Given:  Yes (pt declined to sign notice)    Alisa App, RN 01/21/2024, 12:41 PM

## 2024-01-21 NOTE — Discharge Summary (Signed)
 Physician Discharge Summary   Patient: Katie Vasquez MRN: [id number] DOB: December 19, 1940  Admit date:     01/19/2024  Discharge date: 01/21/24  Discharge Physician: Alejandro Marker, DO   PCP: Dwight Trula SQUIBB, MD   Recommendations at discharge:   Follow-up with PCP within 1 to 2 weeks repeat CBC, CMP, mag, Phos within 1 week  Discharge Diagnoses: Principal Problem:   Acute hyponatremia Active Problems:   Chronic diastolic CHF (congestive heart failure) (HCC)   Postural dizziness with presyncope   Acute anemia   Hypokalemia   Generalized weakness   History of essential hypertension  Resolved Problems:   * No resolved hospital problems. [facility] Course: The patient is an 83 year old Caucasian female with a past medical history significant for essential hypertension, chronic diastolic CHF, history of anemia, degenerative disc disease, history of arthritis and back pain along with a history of vertigo who presented to the hospital with dizziness from home.  Recently she underwent lumbar kyphoplasty and since then she has not been feeling very well and has been complaining of dizziness.  She had a 1 day history of intermittent dizziness and lightheadedness when she was rising from a sitting standing position without loss of consciousness.  Further workup was done and on admission she was found to have hyponatremia.  Currently she is being admitted and evaluated for her hyponatremia, generalized weakness, hypokalemia and dizziness with presyncopal episode.  Symptoms resolved and her sodium trended back up.  She was given IV fluid hydration and PT OT evaluated and she was not orthostatic.  She is deemed medically stable for discharge at this time and will need follow-up with PCP.  Assessment and Plan:  Acute Hypo-osmolar Hyponatremia: Na+ was 123 and improved to 132. Differential includes potential SIADH, including potential contribution from postoperative SIADH in the context of recently completed  lumbar kyphoplasty, with potential pharmacologic contribution from outpatient chlorthalidone , particularly noting her concomitant hypochloremia.  Given a 1 L bolus of NS x1. Start mIVF with NS @ 75 mL/hr. Urine Osm was 213. Urine Na+ was 54 and Urine Cr was 20. TSH was 1.780. UA never done. Strict I's and O's and Daily weights. Uric Acid Level was 4.8; CXR Negative.  Hold chlorthalidone  and recommend regular diet.  Follow-up with PCP within 1 week given that it is much improved and closer to baseline.  Hypokalemia: K+ is 3.8. CTM and Trend and Replete as Necessary. Repeat CMP in the AM  Leukocytosis: Mild and Likely reactive. Took her Budesonide . No S/Sx of Infection. WBC went from 9.6 -> 10.1 -> 11.0. CTM and Trend and repeat CBC w/in 1 week   Metabolic Acidosis: Mild. Has a CO2 of 21, AG of 11, Chloride Level of 100. CTM and Trend and repeat CMP in the AM  Hx of Chronic Diastolic CHF: Recent ECHO done in January 2024 and showed an LVEF of 50-55% and G1DD. BNP was 62.1. CTM for S/Sx of Volume overload. Strict I's and O's and daily weights.   Presyncope: Had an episode of dizziness and Lightheadedness and was in the setting of when she went from a seated to standing position. Given 1 Liter of NS bolus. Start IVF with mIVF with NS @ 75 mL/hr. Fall precautions. Check Orthostatics. PT/OT to evaluate and treat. Hold Chlorthalidone  and Spironolactone  for now and resume spironolactone  discharge but continue to hold chlorthalidone .   Generalized Weakness: Had 3-4 days of Generalized Weakness in the absence of evidence of acute focal neurologic deficits. W/U as  delineated and PT OT recommending no follow-up  Essential HTN: Holding Spironolactone , Chlorthalidone . Resume Losartan  25 mg po daily. CTM BP per Protocol. Last BP reading was elevated at 162/85. Will add IV Hydralazine  10 mg q6hprn for SBP >170 or DBP >100  Recent Lumbar Kyphoplasty: C/w Pain Control w/ Hydrocodone -Acetaminophen  1 tab po q6hprn  Moderate Pain   Normocytic Anemia: Hgb/Hct went from 10.8/30.7 -> 11.4/33.6 -> 11.5/32.9 -> 11.6/32.5. Checked Anemia Panel showed an iron level of 46, UIBC of 265, TIBC of 311, saturation ration of 15%, ferritin of 127, vitamin B12 477. CTM for S/Sx of Bleeding; No overt bleeding noted. FOBT negative. Repeat CBC i within 1 week  Thrombocytosis: Mild and likely reactive. Plt Count went from 445 -> 451 -> 510. CTM and Trend and repeat CBC w/in 1 week  Hypoalbuminemia: Patient's Albumin is now 3.4. CTM and Trend and repeat CMP in the AM  Consultants: None Procedures performed: As delineated as above  Disposition: Home Diet recommendation:  Regular diet DISCHARGE MEDICATION: Allergies as of 01/21/2024       Reactions   Tramadol  Other (See Comments)   Sever GI upset; colitis   Entresto  [sacubitril -valsartan ] Other (See Comments)   Back pain, neck pain, abdominal pain   Methylprednisolone  Other (See Comments)   Medrol    Other    Mycins= GI upset   Prednisone Diarrhea   GI upset   Keflex [cephalexin] Rash   Mycophenolate Other (See Comments)   General malaise and body aches; myalgia        Medication List     STOP taking these medications    chlorthalidone  25 MG tablet Commonly known as: HYGROTON    ibuprofen 200 MG tablet Commonly known as: ADVIL       TAKE these medications    acetaminophen  325 MG tablet Commonly known as: TYLENOL  Take 2 tablets (650 mg total) by mouth every 6 (six) hours as needed for mild pain (pain score 1-3) (or Fever >/= 101).   aspirin  EC 81 MG tablet Take 81 mg by mouth at bedtime. Swallow whole.   brimonidine 0.15 % ophthalmic solution Commonly known as: ALPHAGAN Place 1 drop into both eyes 3 (three) times daily.   budesonide  3 MG 24 hr capsule Commonly known as: ENTOCORT EC  TAKE 3 CAPSULES (9 MG TOTAL) BY MOUTH DAILY.   losartan  25 MG tablet Commonly known as: COZAAR  TAKE 1 TABLET (25 MG TOTAL) BY MOUTH DAILY. What changed: when  to take this   potassium chloride  10 MEQ CR capsule Commonly known as: MICRO-K  Take 10 mEq by mouth at bedtime.   spironolactone  25 MG tablet Commonly known as: ALDACTONE  Take 25 mg by mouth every morning.        Discharge Exam: Filed Weights   01/19/24 2233 01/21/24 0500  Weight: 56.2 kg 60 kg   Vitals:   01/21/24 1043 01/21/24 1050  BP:    Pulse: (!) 112 95  Resp:    Temp:    SpO2: 98%    Examination: Physical Exam:  Constitutional: Thin Caucasian female in no acute distress Respiratory: Diminished to auscultation bilaterally, no wheezing, rales, rhonchi or crackles. Normal respiratory effort and patient is not tachypenic. No accessory muscle use.  Unlabored breathing Cardiovascular: RRR, no murmurs / rubs / gallops. S1 and S2 auscultated. No extremity edema. Abdomen: Soft, non-tender, non-distended. Bowel sounds positive.  GU: Deferred. Musculoskeletal: No clubbing / cyanosis of digits/nails. No joint deformity upper and lower extremities.  Skin: No rashes, lesions, ulcers limited skin  evaluation. No induration; Warm and dry.  Neurologic: CN 2-12 grossly intact with no focal deficits. Romberg sign and cerebellar reflexes not assessed.  Psychiatric: Normal judgment and insight. Alert and oriented x 3. Normal mood and appropriate affect.   Condition at discharge: stable  The results of significant diagnostics from this hospitalization (including imaging, microbiology, ancillary and laboratory) are listed below for reference.   Imaging Studies: DG Chest Port 1 View Result Date: 01/20/2024 CLINICAL DATA:  Hyponatremia EXAM: PORTABLE CHEST 1 VIEW COMPARISON:  12/10/2017 FINDINGS: Cardiac shadow is stable. Aortic calcifications are noted. The lungs are clear bilaterally. No bony abnormality is noted. IMPRESSION: No acute abnormality noted. Electronically Signed   By: Oneil Devonshire M.D.   On: 01/20/2024 02:58   CT LUMBAR SPINE WO CONTRAST Result Date: 01/06/2024 CLINICAL  DATA:  Low back pain since a fall 3 weeks ago. EXAM: CT LUMBAR SPINE WITHOUT CONTRAST TECHNIQUE: Multidetector CT imaging of the lumbar spine was performed without intravenous contrast administration. Multiplanar CT image reconstructions were also generated. RADIATION DOSE REDUCTION: This exam was performed according to the departmental dose-optimization program which includes automated exposure control, adjustment of the mA and/or kV according to patient size and/or use of iterative reconstruction technique. COMPARISON:  CTA chest, abdomen, and pelvis 12/10/2017 FINDINGS: Segmentation: 5 lumbar type vertebrae. Alignment: Mild upper lumbar levoscoliosis and mild lower lumbar dextroscoliosis. Trace retrolisthesis of T12 on L1. Vertebrae: New L3 compression fracture with 50% height loss, a visible unhealed fracture line along the inferior endplate, mild adjacent sclerosis, and 3 mm retropulsion of the posteroinferior vertebral body. No evidence of a posterior element fracture. No destructive bone lesion. Moderate to prominent multilevel degenerative endplate changes. Paraspinal and other soft tissues: Aortic atherosclerosis. Disc levels: T12-L1: Severe disc space narrowing. Disc bulging without evidence of significant stenosis. L1-2: Severe disc space narrowing asymmetric to the right. Right eccentric disc bulging and mild facet arthrosis result in right lateral recess stenosis and moderate to severe right neural foraminal stenosis without evidence of significant spinal stenosis. L2-3: Disc bulging and moderate facet arthrosis result in mild bilateral neural foraminal stenosis without evidence of significant spinal stenosis. L3-4: Circumferential disc bulging and moderate to severe left facet arthrosis result in asymmetric left lateral recess stenosis and mild right and moderate left neural foraminal stenosis without evidence of significant spinal stenosis. L4-5: Moderate to severe left-sided disc space narrowing.  Left eccentric disc bulging match that circumferential disc bulging and mild right and moderate left facet arthrosis likely result in mild bilateral lateral recess stenosis and moderate left greater than right neural foraminal stenosis without evidence of significant spinal stenosis. L5-S1: Moderate disc space narrowing. Circumferential disc bulging and mild-to-moderate facet arthrosis result in moderate to severe bilateral neural foraminal stenosis without evidence of significant spinal stenosis. IMPRESSION: 1. Unhealed, likely subacute L3 compression fracture with 50% height loss and 3 mm retropulsion. 2. Advanced lumbar disc and facet degeneration without evidence of significant spinal stenosis. 3. Moderate to severe neural foraminal stenosis on the right at L1-2 and bilaterally at L5-S1. 4.  Aortic Atherosclerosis (ICD10-I70.0). Electronically Signed   By: Dasie Hamburg M.D.   On: 01/06/2024 11:20   Microbiology: Results for orders placed or performed in visit on 12/15/22  Calprotectin, Fecal     Status: None   Collection Time: 12/15/22  1:22 PM   Specimen: Blood   BLD  Result Value Ref Range Status   Calprotectin, Fecal 100 0 - 120 ug/g Final    Comment: Concentration  Interpretation   Follow-Up < 5 - 50 ug/g     Normal           None >50 -120 ug/g     Borderline       Re-evaluate in 4-6 weeks     >120 ug/g     Abnormal         Repeat as clinically                                    indicated    Labs: CBC: Recent Labs  Lab 01/19/24 2320 01/20/24 0737 01/20/24 1926 01/21/24 0318  WBC 9.6 10.1  --  11.0*  NEUTROABS 5.5 6.4  --  7.6  HGB 10.8* 11.4* 11.5* 11.6*  HCT 30.7* 33.6* 32.9* 32.5*  MCV 83.0 84.8  --  83.3  PLT 445* 451*  --  510*   Basic Metabolic Panel: Recent Labs  Lab 01/19/24 2320 01/20/24 1926 01/21/24 0318  NA 123* 128* 132*  K 3.4* 3.8 3.8  CL 91* 96* 100  CO2 21* 22 21*  GLUCOSE 100* 126* 87  BUN 17 15 12   CREATININE 0.97 1.21* 0.95  CALCIUM 8.6* 9.0  9.1  MG  --  1.8 1.9  PHOS  --   --  3.1   Liver Function Tests: Recent Labs  Lab 01/19/24 2320 01/20/24 1926 01/21/24 0318  AST 17 17 17   ALT 12 13 12   ALKPHOS 72 74 67  BILITOT 0.5 0.6 0.5  PROT 5.4* 5.7* 5.8*  ALBUMIN 3.3* 3.4* 3.4*   CBG: No results for input(s): GLUCAP in the last 168 hours.  Discharge time spent: greater than 30 minutes.  Signed: Alejandro Marker, DO [facility] 01/21/2024

## 2024-01-21 NOTE — Evaluation (Signed)
 Occupational Therapy Evaluation Patient Details Name: Katie Vasquez MRN: [id number] DOB: [birth year] Today's Date: 01/21/2024   History of Present Illness   83 y.o. female presented to [facility] ED on 01/19/2024 with acute hyponatremia and dizziness. Labs revealed hyponatremia and hypokalemia. Admitted for treatment of Acute hypo-osmolar hyponatremia,  Acute normocytic anemia, and hypokalemia.  MVH:QIONGE back surgery. essential hypertension, chronic diastolic heart failure.     Clinical Impressions Patient admitted for the diagnosis above.  Patient is independent at home, and is at her baseline with no acute therapy needs.  Patient wants to go home.  No post acute rehab needs, and no DME needs.       If plan is discharge home, recommend the following:   Other (comment) (n/a)     Functional Status Assessment   Patient has not had a recent decline in their functional status     Equipment Recommendations   None recommended by OT     Recommendations for Other Services         Precautions/Restrictions   Precautions Precautions: Back Precaution Booklet Issued: No Recall of Precautions/Restrictions: Intact Restrictions Weight Bearing Restrictions Per Provider Order: No     Mobility Bed Mobility               General bed mobility comments: up in recliner    Transfers Overall transfer level: Independent Equipment used: None                      Balance Overall balance assessment: No apparent balance deficits (not formally assessed)                                         ADL either performed or assessed with clinical judgement   ADL Overall ADL's : Independent                                             Vision Patient Visual Report: No change from baseline       Perception Perception: Not tested       Praxis Praxis: Not tested       Pertinent Vitals/Pain Pain Assessment Pain Assessment: Faces Faces  Pain Scale: Hurts a little bit Pain Location: back surgical site Pain Descriptors / Indicators: Sore Pain Intervention(s): Monitored during session     Extremity/Trunk Assessment Upper Extremity Assessment Upper Extremity Assessment: Overall WFL for tasks assessed   Lower Extremity Assessment Lower Extremity Assessment: Defer to PT evaluation   Cervical / Trunk Assessment Cervical / Trunk Assessment: Back Surgery   Communication Communication Communication: No apparent difficulties   Cognition Arousal: Alert Behavior During Therapy: Anxious Cognition: No apparent impairments                               Following commands: Intact       Cueing  General Comments   Cueing Techniques: Verbal cues;Tactile cues;Visual cues      Exercises     Shoulder Instructions      Home Living Family/patient expects to be discharged to:: Private residence Living Arrangements: Spouse/significant other Available Help at Discharge: Available 24 hours/day Type of Home: House Home Access: Stairs to enter [employer] of Steps: 3 Entrance Stairs-Rails:  None Home Layout: Able to live on main level with bedroom/bathroom;Multi-level Alternate Level Stairs-Number of Steps: flight   Bathroom Shower/Tub: Chief Strategy Officer: Standard Bathroom Accessibility: Yes   Home Equipment: Grab bars - tub/shower;Rolling Walker (2 wheels);Cane - single point          Prior Functioning/Environment Prior Level of Function : Independent/Modified Independent                    OT Problem List: Impaired balance (sitting and/or standing)   OT Treatment/Interventions:        OT Goals(Current goals can be found in the care plan section)   Acute Rehab OT Goals Patient Stated Goal: return home OT Goal Formulation: With patient Time For Goal Achievement: 01/28/24 Potential to Achieve Goals: Good   OT Frequency:       Co-evaluation               AM-PAC OT "6 Clicks" Daily Activity     Outcome Measure Help from another person eating meals?: None Help from another person taking care of personal grooming?: None Help from another person toileting, which includes using toliet, bedpan, or urinal?: None Help from another person bathing (including washing, rinsing, drying)?: None Help from another person to put on and taking off regular upper body clothing?: None Help from another person to put on and taking off regular lower body clothing?: None 6 Click Score: 24   End of Session Nurse Communication: Mobility status  Activity Tolerance: Patient tolerated treatment well Patient left: in chair;with call bell/phone within reach  OT Visit Diagnosis: Unsteadiness on feet (R26.81)                Time: 0950-1007 OT Time Calculation (min): 17 min Charges:  OT General Charges $OT Visit: 1 Visit OT Evaluation $OT Eval Moderate Complexity: 1 Mod  01/21/2024  RP, OTR/L  Acute Rehabilitation Services  Office:  [phone]   Benjamen Brand 01/21/2024, 10:10 AM

## 2024-01-21 NOTE — Care Management CC44 (Signed)
 Condition Code 44 Documentation Completed  Patient Details  Name: Katie Vasquez MRN: [id number] Date of Birth: [birth year]   Condition Code 44 given:    Patient signature on Condition Code 44 notice:    Documentation of 2 MD's agreement:    Code 44 added to claim:       Alisa App, RN 01/21/2024, 12:40 PM

## 2024-01-21 NOTE — Progress Notes (Signed)
 Discharge instructions and med list reviewed with pt and SO.  Both verbalized good understanding.  Tele removed, IV removed on prior shift.  All questions answered.  Pt Dc'd via wheelchair

## 2024-01-21 NOTE — Progress Notes (Signed)
 Nurse requested Mobility Specialist to perform oxygen saturation test with pt which includes removing pt from oxygen both at rest and while ambulating.  Below are the results from that testing.     Patient Saturations on Room Air at Rest = spO2 98%  Patient Saturations on Room Air while Ambulating = sp02 96% .  Rested and performed pursed lip breathing for 1 minute with sp02 at 97%.  At end of testing pt left in room on RA.  Reported results to nurse.

## 2024-01-25 DIAGNOSIS — E871 Hypo-osmolality and hyponatremia: Secondary | ICD-10-CM | POA: Diagnosis not present

## 2024-01-25 DIAGNOSIS — I10 Essential (primary) hypertension: Secondary | ICD-10-CM | POA: Diagnosis not present

## 2024-01-25 DIAGNOSIS — E876 Hypokalemia: Secondary | ICD-10-CM | POA: Diagnosis not present

## 2024-01-25 DIAGNOSIS — D72829 Elevated white blood cell count, unspecified: Secondary | ICD-10-CM | POA: Diagnosis not present

## 2024-01-26 DIAGNOSIS — R102 Pelvic and perineal pain: Secondary | ICD-10-CM | POA: Diagnosis not present

## 2024-01-26 DIAGNOSIS — M47812 Spondylosis without myelopathy or radiculopathy, cervical region: Secondary | ICD-10-CM | POA: Diagnosis not present

## 2024-02-02 DIAGNOSIS — H35372 Puckering of macula, left eye: Secondary | ICD-10-CM | POA: Diagnosis not present

## 2024-02-02 DIAGNOSIS — H30111 Disseminated chorioretinal inflammation of posterior pole, right eye: Secondary | ICD-10-CM | POA: Diagnosis not present

## 2024-02-02 DIAGNOSIS — H35363 Drusen (degenerative) of macula, bilateral: Secondary | ICD-10-CM | POA: Diagnosis not present

## 2024-02-02 DIAGNOSIS — H35341 Macular cyst, hole, or pseudohole, right eye: Secondary | ICD-10-CM | POA: Diagnosis not present

## 2024-02-02 DIAGNOSIS — H353131 Nonexudative age-related macular degeneration, bilateral, early dry stage: Secondary | ICD-10-CM | POA: Diagnosis not present

## 2024-02-03 DIAGNOSIS — D72829 Elevated white blood cell count, unspecified: Secondary | ICD-10-CM | POA: Diagnosis not present

## 2024-02-08 DIAGNOSIS — H209 Unspecified iridocyclitis: Secondary | ICD-10-CM | POA: Diagnosis not present

## 2024-02-08 DIAGNOSIS — M1990 Unspecified osteoarthritis, unspecified site: Secondary | ICD-10-CM | POA: Diagnosis not present

## 2024-02-08 DIAGNOSIS — M359 Systemic involvement of connective tissue, unspecified: Secondary | ICD-10-CM | POA: Diagnosis not present

## 2024-02-10 DIAGNOSIS — M359 Systemic involvement of connective tissue, unspecified: Secondary | ICD-10-CM | POA: Diagnosis not present

## 2024-02-11 ENCOUNTER — Other Ambulatory Visit (HOSPITAL_COMMUNITY): Payer: Self-pay | Admitting: Neurological Surgery

## 2024-02-11 DIAGNOSIS — M5416 Radiculopathy, lumbar region: Secondary | ICD-10-CM

## 2024-02-14 ENCOUNTER — Ambulatory Visit (HOSPITAL_COMMUNITY)

## 2024-02-15 DIAGNOSIS — H35373 Puckering of macula, bilateral: Secondary | ICD-10-CM | POA: Diagnosis not present

## 2024-02-15 DIAGNOSIS — H30033 Focal chorioretinal inflammation, peripheral, bilateral: Secondary | ICD-10-CM | POA: Diagnosis not present

## 2024-02-15 DIAGNOSIS — Z961 Presence of intraocular lens: Secondary | ICD-10-CM | POA: Diagnosis not present

## 2024-02-17 ENCOUNTER — Ambulatory Visit (HOSPITAL_COMMUNITY)
Admission: RE | Admit: 2024-02-17 | Discharge: 2024-02-17 | Disposition: A | Discharge status: Home / Self Care | Source: Ambulatory Visit | Attending: Neurological Surgery | Admitting: Neurological Surgery

## 2024-02-17 DIAGNOSIS — M5416 Radiculopathy, lumbar region: Secondary | ICD-10-CM | POA: Diagnosis not present

## 2024-02-17 DIAGNOSIS — D473 Essential (hemorrhagic) thrombocythemia: Secondary | ICD-10-CM | POA: Diagnosis not present

## 2024-02-17 DIAGNOSIS — M5116 Intervertebral disc disorders with radiculopathy, lumbar region: Secondary | ICD-10-CM | POA: Diagnosis not present

## 2024-02-17 DIAGNOSIS — M4856XA Collapsed vertebra, not elsewhere classified, lumbar region, initial encounter for fracture: Secondary | ICD-10-CM | POA: Diagnosis not present

## 2024-02-17 DIAGNOSIS — M4807 Spinal stenosis, lumbosacral region: Secondary | ICD-10-CM | POA: Diagnosis not present

## 2024-02-17 DIAGNOSIS — R29890 Loss of height: Secondary | ICD-10-CM | POA: Diagnosis not present

## 2024-02-21 DIAGNOSIS — B0052 Herpesviral keratitis: Secondary | ICD-10-CM | POA: Diagnosis not present

## 2024-02-24 ENCOUNTER — Other Ambulatory Visit: Payer: Self-pay | Admitting: Gastroenterology

## 2024-02-24 DIAGNOSIS — B0052 Herpesviral keratitis: Secondary | ICD-10-CM | POA: Diagnosis not present

## 2024-03-01 DIAGNOSIS — M359 Systemic involvement of connective tissue, unspecified: Secondary | ICD-10-CM | POA: Diagnosis not present

## 2024-03-07 DIAGNOSIS — Z961 Presence of intraocular lens: Secondary | ICD-10-CM | POA: Diagnosis not present

## 2024-03-07 DIAGNOSIS — B0052 Herpesviral keratitis: Secondary | ICD-10-CM | POA: Diagnosis not present

## 2024-03-17 ENCOUNTER — Encounter: Payer: Self-pay | Admitting: Cardiovascular Disease

## 2024-03-20 DIAGNOSIS — B0052 Herpesviral keratitis: Secondary | ICD-10-CM | POA: Diagnosis not present

## 2024-03-21 DIAGNOSIS — M8008XA Age-related osteoporosis with current pathological fracture, vertebra(e), initial encounter for fracture: Secondary | ICD-10-CM | POA: Diagnosis not present

## 2024-04-10 DIAGNOSIS — H1789 Other corneal scars and opacities: Secondary | ICD-10-CM | POA: Diagnosis not present

## 2024-04-14 DIAGNOSIS — S32030G Wedge compression fracture of third lumbar vertebra, subsequent encounter for fracture with delayed healing: Secondary | ICD-10-CM | POA: Diagnosis not present

## 2024-04-14 DIAGNOSIS — M8008XA Age-related osteoporosis with current pathological fracture, vertebra(e), initial encounter for fracture: Secondary | ICD-10-CM | POA: Diagnosis not present

## 2024-04-19 ENCOUNTER — Encounter: Payer: Self-pay | Admitting: Gastroenterology

## 2024-04-19 ENCOUNTER — Ambulatory Visit: Admitting: Gastroenterology

## 2024-04-19 VITALS — BP 146/68 | HR 64 | Ht 62.5 in

## 2024-04-19 DIAGNOSIS — K52831 Collagenous colitis: Secondary | ICD-10-CM | POA: Diagnosis not present

## 2024-04-19 DIAGNOSIS — K529 Noninfective gastroenteritis and colitis, unspecified: Secondary | ICD-10-CM
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
## 2024-04-19 NOTE — Patient Instructions (Signed)
 Follow-up 1 year. Sooner if needed.   _______________________________________________________  If your blood pressure at your visit was 140/90 or greater, please contact your primary care physician to follow up on this.  _______________________________________________________  If you are age 84 or older, your body mass index should be between 23-30. Your Body mass index is 23.81 kg/m. If this is out of the aforementioned range listed, please consider follow up with your Primary Care Provider.  If you are age 47 or younger, your body mass index should be between 19-25. Your Body mass index is 23.81 kg/m. If this is out of the aformentioned range listed, please consider follow up with your Primary Care Provider.   ________________________________________________________  The [facility] providers would like to encourage you to use MYCHART to communicate with providers for non-urgent requests or questions.  Due to long hold times on the telephone, sending your provider a message by [facility] may be a faster and more efficient way to get a response.  Please allow 48 business hours for a response.  Please remember that this is for non-urgent requests.  _______________________________________________________  Thank you for choosing me and [facility].  Dr. Wilhelmenia

## 2024-04-21 DIAGNOSIS — H30033 Focal chorioretinal inflammation, peripheral, bilateral: Secondary | ICD-10-CM | POA: Diagnosis not present

## 2024-04-21 DIAGNOSIS — H35373 Puckering of macula, bilateral: Secondary | ICD-10-CM | POA: Diagnosis not present

## 2024-04-21 DIAGNOSIS — Z961 Presence of intraocular lens: Secondary | ICD-10-CM | POA: Diagnosis not present

## 2024-04-21 DIAGNOSIS — H3581 Retinal edema: Secondary | ICD-10-CM | POA: Diagnosis not present

## 2024-05-08 DIAGNOSIS — Z1231 Encounter for screening mammogram for malignant neoplasm of breast: Secondary | ICD-10-CM | POA: Diagnosis not present

## 2024-05-19 DIAGNOSIS — E871 Hypo-osmolality and hyponatremia: Secondary | ICD-10-CM | POA: Diagnosis not present

## 2024-05-19 DIAGNOSIS — D649 Anemia, unspecified: Secondary | ICD-10-CM | POA: Diagnosis not present

## 2024-05-19 DIAGNOSIS — R0609 Other forms of dyspnea: Secondary | ICD-10-CM | POA: Diagnosis not present

## 2024-05-25 DIAGNOSIS — Z961 Presence of intraocular lens: Secondary | ICD-10-CM | POA: Diagnosis not present

## 2024-05-25 DIAGNOSIS — H04123 Dry eye syndrome of bilateral lacrimal glands: Secondary | ICD-10-CM | POA: Diagnosis not present

## 2024-05-25 DIAGNOSIS — H52203 Unspecified astigmatism, bilateral: Secondary | ICD-10-CM | POA: Diagnosis not present

## 2024-05-25 DIAGNOSIS — H31001 Unspecified chorioretinal scars, right eye: Secondary | ICD-10-CM | POA: Diagnosis not present

## 2024-05-25 DIAGNOSIS — H2011 Chronic iridocyclitis, right eye: Secondary | ICD-10-CM | POA: Diagnosis not present

## 2024-05-25 DIAGNOSIS — H401131 Primary open-angle glaucoma, bilateral, mild stage: Secondary | ICD-10-CM | POA: Diagnosis not present

## 2024-06-07 DIAGNOSIS — H353131 Nonexudative age-related macular degeneration, bilateral, early dry stage: Secondary | ICD-10-CM | POA: Diagnosis not present

## 2024-06-07 DIAGNOSIS — H30111 Disseminated chorioretinal inflammation of posterior pole, right eye: Secondary | ICD-10-CM | POA: Diagnosis not present

## 2024-06-07 DIAGNOSIS — H35363 Drusen (degenerative) of macula, bilateral: Secondary | ICD-10-CM | POA: Diagnosis not present

## 2024-06-07 DIAGNOSIS — H35341 Macular cyst, hole, or pseudohole, right eye: Secondary | ICD-10-CM | POA: Diagnosis not present

## 2024-06-07 DIAGNOSIS — H401131 Primary open-angle glaucoma, bilateral, mild stage: Secondary | ICD-10-CM | POA: Diagnosis not present

## 2024-06-07 DIAGNOSIS — H35372 Puckering of macula, left eye: Secondary | ICD-10-CM | POA: Diagnosis not present

## 2024-06-15 DIAGNOSIS — L0390 Cellulitis, unspecified: Secondary | ICD-10-CM | POA: Diagnosis not present

## 2024-06-15 DIAGNOSIS — S81801A Unspecified open wound, right lower leg, initial encounter: Secondary | ICD-10-CM | POA: Diagnosis not present

## 2024-06-23 DIAGNOSIS — S32030G Wedge compression fracture of third lumbar vertebra, subsequent encounter for fracture with delayed healing: Secondary | ICD-10-CM | POA: Diagnosis not present

## 2024-06-23 DIAGNOSIS — M47812 Spondylosis without myelopathy or radiculopathy, cervical region: Secondary | ICD-10-CM | POA: Diagnosis not present

## 2024-07-06 DIAGNOSIS — Z96652 Presence of left artificial knee joint: Secondary | ICD-10-CM | POA: Diagnosis not present

## 2024-07-12 DIAGNOSIS — Z23 Encounter for immunization: Secondary | ICD-10-CM | POA: Diagnosis not present

## 2024-07-12 DIAGNOSIS — H10413 Chronic giant papillary conjunctivitis, bilateral: Secondary | ICD-10-CM | POA: Diagnosis not present

## 2024-07-25 DIAGNOSIS — I10 Essential (primary) hypertension: Secondary | ICD-10-CM | POA: Diagnosis not present

## 2024-07-25 DIAGNOSIS — I5042 Chronic combined systolic (congestive) and diastolic (congestive) heart failure: Secondary | ICD-10-CM | POA: Diagnosis not present

## 2024-07-25 DIAGNOSIS — Z1331 Encounter for screening for depression: Secondary | ICD-10-CM | POA: Diagnosis not present

## 2024-07-25 DIAGNOSIS — R413 Other amnesia: Secondary | ICD-10-CM | POA: Diagnosis not present

## 2024-07-25 DIAGNOSIS — D473 Essential (hemorrhagic) thrombocythemia: Secondary | ICD-10-CM | POA: Diagnosis not present

## 2024-07-25 DIAGNOSIS — E871 Hypo-osmolality and hyponatremia: Secondary | ICD-10-CM | POA: Diagnosis not present

## 2024-07-25 DIAGNOSIS — S81801A Unspecified open wound, right lower leg, initial encounter: Secondary | ICD-10-CM | POA: Diagnosis not present

## 2024-07-25 DIAGNOSIS — I420 Dilated cardiomyopathy: Secondary | ICD-10-CM | POA: Diagnosis not present

## 2024-07-25 DIAGNOSIS — Z0000 Encounter for general adult medical examination without abnormal findings: Secondary | ICD-10-CM | POA: Diagnosis not present

## 2024-07-25 DIAGNOSIS — Z23 Encounter for immunization: Secondary | ICD-10-CM | POA: Diagnosis not present

## 2024-07-26 DIAGNOSIS — H401131 Primary open-angle glaucoma, bilateral, mild stage: Secondary | ICD-10-CM | POA: Diagnosis not present

## 2024-08-02 ENCOUNTER — Telehealth: Payer: Self-pay | Admitting: Gastroenterology

## 2024-08-02 MED ORDER — BUDESONIDE 3 MG PO CPEP: 9.0000 mg | ORAL_CAPSULE | Freq: Every day | ORAL | 1 refills | Status: AC

## 2024-08-02 NOTE — Telephone Encounter (Signed)
 Refill has been sent to pharmacy.

## 2024-08-02 NOTE — Telephone Encounter (Signed)
 Patient is requesting for a refill for budesonide  be sent to [facility]. Please advise, thank you

## 2024-08-03 NOTE — Telephone Encounter (Signed)
 Left message for patient

## 2024-08-03 NOTE — Telephone Encounter (Signed)
 Patient is requesting a call to discuss possible generic forms of budesonide . States medication has high out of pocket cost. Please advise, thank you

## 2024-08-04 NOTE — Telephone Encounter (Signed)
 Tried returning call to patient. Phone just rings. Unable to leave message.   RX PA Team- Can we try a test claim for generic to see what the cost would be for Entocort EC ?

## 2024-08-04 NOTE — Telephone Encounter (Signed)
 Patient returning call.

## 2024-08-08 NOTE — Telephone Encounter (Signed)
 Spoke with patient this afternoon. She was able to get her prescription at [facility] with a discount card. Patient will let me know if she has any other issues.

## 2024-08-09 DIAGNOSIS — E871 Hypo-osmolality and hyponatremia: Secondary | ICD-10-CM | POA: Diagnosis not present

## 2024-08-09 DIAGNOSIS — R5383 Other fatigue: Secondary | ICD-10-CM | POA: Diagnosis not present

## 2024-08-11 DIAGNOSIS — R2689 Other abnormalities of gait and mobility: Secondary | ICD-10-CM | POA: Diagnosis not present

## 2024-08-17 DIAGNOSIS — R2689 Other abnormalities of gait and mobility: Secondary | ICD-10-CM | POA: Diagnosis not present

## 2024-08-22 DIAGNOSIS — R2689 Other abnormalities of gait and mobility: Secondary | ICD-10-CM | POA: Diagnosis not present

## 2024-08-24 DIAGNOSIS — R2689 Other abnormalities of gait and mobility: Secondary | ICD-10-CM | POA: Diagnosis not present

## 2024-08-29 DIAGNOSIS — R2689 Other abnormalities of gait and mobility: Secondary | ICD-10-CM | POA: Diagnosis not present

## 2024-09-04 DIAGNOSIS — R2689 Other abnormalities of gait and mobility: Secondary | ICD-10-CM | POA: Diagnosis not present

## 2024-09-06 DIAGNOSIS — R2689 Other abnormalities of gait and mobility: Secondary | ICD-10-CM | POA: Diagnosis not present

## 2024-09-11 DIAGNOSIS — R2689 Other abnormalities of gait and mobility: Secondary | ICD-10-CM | POA: Diagnosis not present

## 2024-09-11 DIAGNOSIS — H401131 Primary open-angle glaucoma, bilateral, mild stage: Secondary | ICD-10-CM | POA: Diagnosis not present

## 2024-09-12 ENCOUNTER — Telehealth: Payer: Self-pay | Admitting: Gastroenterology

## 2024-09-12 NOTE — Telephone Encounter (Signed)
 Patient called stating she is having a lot of diarrhea. Patient is requesting a call to discuss recommendations to help. Please advise, thank you

## 2024-09-12 NOTE — Telephone Encounter (Signed)
 The pt states that she ate some food that did not agree with her over the Thanksgiving holiday.  She has had diarrhea off on since. (She thinks nutmeg causes the issues)  She is on budesonide  9 mg daily and that has worked well.  She is going to try imodium and call back if that does not seem to work.  No other symptoms or complaints.

## 2024-09-13 ENCOUNTER — Other Ambulatory Visit: Payer: Self-pay

## 2024-09-13 DIAGNOSIS — K529 Noninfective gastroenteritis and colitis, unspecified: Secondary | ICD-10-CM

## 2024-09-13 DIAGNOSIS — H1131 Conjunctival hemorrhage, right eye: Secondary | ICD-10-CM | POA: Diagnosis not present

## 2024-09-13 DIAGNOSIS — R2689 Other abnormalities of gait and mobility: Secondary | ICD-10-CM | POA: Diagnosis not present

## 2024-09-13 DIAGNOSIS — R197 Diarrhea, unspecified: Secondary | ICD-10-CM

## 2024-09-13 NOTE — Telephone Encounter (Signed)
 The pt states that she has a flare of her colitis and budesonide  is not working any longer.  She take 9 mg daily.   Diarrhea 5 times daily - she tried imodium with out relief. She would like to have alternative treatment options.   Last seen 7/16 and does not think she can wait for next available appt with Pod C

## 2024-09-13 NOTE — Telephone Encounter (Signed)
 She needs to bring in a stool sample to rule out other etiologies at this time before I can make further recommendations. Stool Culture, C. difficile PCR, ova/parasite, fecal calprotectin. She needs to come in for a CBC/CMP/ESR/CRP. Further recommendations can be made after these have returned. How much Imodium is she using? Thanks. GM

## 2024-09-13 NOTE — Telephone Encounter (Addendum)
 Patient called stating previous recommendation have only seemed to make things worse. She is having constant diarrhea.  Patient is very concerned. Also states previously colitis infected her eyes and is concerned this will happen again. Patient states she will not be available from home phone number. Request a call at [phone]. Please advise, thank you.

## 2024-09-13 NOTE — Telephone Encounter (Signed)
 Spoke with the pt and made her aware of the recommendations. Labs and stool test ordered.

## 2024-09-13 NOTE — Telephone Encounter (Signed)
 Attempted to reach pt at number provided.  No answer message left

## 2024-10-13 ENCOUNTER — Encounter: Payer: Self-pay | Admitting: Cardiovascular Disease

## 2024-10-13 ENCOUNTER — Ambulatory Visit: Attending: Cardiovascular Disease | Admitting: Cardiovascular Disease

## 2024-10-13 VITALS — BP 149/59 | HR 94 | Ht 62.5 in | Wt 142.5 lb

## 2024-10-13 DIAGNOSIS — I10 Essential (primary) hypertension: Secondary | ICD-10-CM

## 2024-10-13 DIAGNOSIS — E871 Hypo-osmolality and hyponatremia: Secondary | ICD-10-CM

## 2024-10-13 DIAGNOSIS — I447 Left bundle-branch block, unspecified: Secondary | ICD-10-CM | POA: Diagnosis not present

## 2024-10-13 DIAGNOSIS — I5042 Chronic combined systolic (congestive) and diastolic (congestive) heart failure: Secondary | ICD-10-CM

## 2024-10-13 MED ORDER — DAPAGLIFLOZIN PROPANEDIOL 10 MG PO TABS: 10.0000 mg | ORAL_TABLET | Freq: Every day | ORAL | Status: DC

## 2024-10-13 MED ORDER — DAPAGLIFLOZIN PROPANEDIOL 10 MG PO TABS: 10.0000 mg | ORAL_TABLET | Freq: Every day | ORAL | 11 refills | Status: AC

## 2024-10-13 MED ORDER — FARXIGA 10 MG PO TABS: 10.0000 mg | ORAL_TABLET | Freq: Every day | ORAL | Status: AC
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
## 2024-10-13 NOTE — Patient Instructions (Signed)
 Medication Instructions:  Start Farxiga   10 mg  daily   *If you need a refill on your cardiac medications before your next appointment, please call your pharmacy*   Lab Work: Not needed    Testing/Procedures:  1) Your physician has requested that you have an echocardiogram. Echocardiography is a painless test that uses sound waves to create images of your heart. It provides your doctor with information about the size and shape of your heart and how well your hearts chambers and valves are working. This procedure takes approximately one hour. There are no restrictions for this procedure. Please do NOT wear cologne, perfume, aftershave, or lotions (deodorant is allowed). Please arrive 15 minutes prior to your appointment time.  Please note: We ask at that you not bring children with you during ultrasound (echo/ vascular) testing. Due to room size and safety concerns, children are not allowed in the ultrasound rooms during exams. Our front office staff cannot provide observation of children in our lobby area while testing is being conducted. An adult accompanying a patient to their appointment will only be allowed in the ultrasound room at the discretion of the ultrasound technician under special circumstances. We apologize for any inconvenience.   Follow-Up: At [facility], you and your health needs are our priority.  As part of our continuing mission to provide you with exceptional heart care, we have created designated Provider Care Teams.  These Care Teams include your primary Cardiologist (physician) and Advanced Practice Providers (APPs -  Physician Assistants and Nurse Practitioners) who all work together to provide you with the care you need, when you need it.     Your next appointment:   6 month(s)  The format for your next appointment:   In Person  Provider:   Jerel Balding, MD   Other Instructions

## 2024-10-18 ENCOUNTER — Other Ambulatory Visit (HOSPITAL_COMMUNITY): Payer: Self-pay | Admitting: Neurological Surgery

## 2024-10-18 DIAGNOSIS — M8448XA Pathological fracture, other site, initial encounter for fracture: Secondary | ICD-10-CM

## 2024-10-20 ENCOUNTER — Other Ambulatory Visit: Payer: Self-pay | Admitting: Neurological Surgery

## 2024-10-20 DIAGNOSIS — M8448XA Pathological fracture, other site, initial encounter for fracture: Secondary | ICD-10-CM

## 2024-10-24 ENCOUNTER — Other Ambulatory Visit: Payer: Self-pay | Admitting: Gastroenterology

## 2024-11-16 ENCOUNTER — Ambulatory Visit (HOSPITAL_COMMUNITY)

## 2024-11-21 ENCOUNTER — Other Ambulatory Visit
